# Patient Record
Sex: Male | Born: 1990 | Race: White | Hispanic: No | Marital: Single | Smoking: Former smoker
Health system: Southern US, Community
[De-identification: ages and names within clinical notes are randomized; demographics above are authoritative.]

## PROBLEM LIST (undated history)

## (undated) DIAGNOSIS — K5090 Crohn's disease, unspecified, without complications: Secondary | ICD-10-CM

## (undated) DIAGNOSIS — N200 Calculus of kidney: Secondary | ICD-10-CM

## (undated) DIAGNOSIS — I10 Essential (primary) hypertension: Secondary | ICD-10-CM

---

## 2015-07-04 DIAGNOSIS — R1031 Right lower quadrant pain: Secondary | ICD-10-CM | POA: Insufficient documentation

## 2015-07-04 DIAGNOSIS — F17200 Nicotine dependence, unspecified, uncomplicated: Secondary | ICD-10-CM | POA: Insufficient documentation

## 2015-07-04 DIAGNOSIS — R110 Nausea: Secondary | ICD-10-CM | POA: Insufficient documentation

## 2015-07-04 DIAGNOSIS — K3580 Unspecified acute appendicitis: Principal | ICD-10-CM | POA: Insufficient documentation

## 2015-07-04 DIAGNOSIS — Z87442 Personal history of urinary calculi: Secondary | ICD-10-CM | POA: Insufficient documentation

## 2015-07-05 ENCOUNTER — Observation Stay: Payer: Medicaid Other | Admitting: Anesthesiology

## 2015-07-05 ENCOUNTER — Encounter
Admission: EM | Disposition: A | Discharge status: Home / Self Care | Payer: Self-pay | Source: Home / Self Care | Attending: Emergency Medicine

## 2015-07-05 ENCOUNTER — Ambulatory Visit: Payer: Self-pay

## 2015-07-05 ENCOUNTER — Observation Stay
Admission: EM | Admit: 2015-07-05 | Discharge: 2015-07-06 | Disposition: A | Discharge status: Home / Self Care | Payer: Medicaid Other | Attending: Surgery | Admitting: Surgery

## 2015-07-05 ENCOUNTER — Observation Stay: Payer: Charity | Admitting: Surgery

## 2015-07-05 ENCOUNTER — Emergency Department: Payer: Medicaid Other

## 2015-07-05 DIAGNOSIS — K36 Other appendicitis: Secondary | ICD-10-CM

## 2015-07-05 DIAGNOSIS — K3580 Unspecified acute appendicitis: Secondary | ICD-10-CM

## 2015-07-05 DIAGNOSIS — R198 Other specified symptoms and signs involving the digestive system and abdomen: Secondary | ICD-10-CM

## 2015-07-05 DIAGNOSIS — R1031 Right lower quadrant pain: Secondary | ICD-10-CM | POA: Diagnosis present

## 2015-07-05 HISTORY — DX: Calculus of kidney: N20.0

## 2015-07-05 LAB — BASIC METABOLIC PANEL
BUN: 15 mg/dL (ref 9.0–28.0)
CO2: 24 mEq/L (ref 22–29)
Calcium: 9.7 mg/dL (ref 8.5–10.5)
Chloride: 109 mEq/L (ref 100–111)
Creatinine: 1 mg/dL (ref 0.7–1.3)
Glucose: 92 mg/dL (ref 70–100)
Potassium: 4.2 mEq/L (ref 3.5–5.1)
Sodium: 143 mEq/L (ref 136–145)

## 2015-07-05 LAB — URINALYSIS, REFLEX TO MICROSCOPIC EXAM IF INDICATED
Bilirubin, UA: NEGATIVE
Blood, UA: NEGATIVE
Glucose, UA: NEGATIVE
Leukocyte Esterase, UA: NEGATIVE
Nitrite, UA: NEGATIVE
Specific Gravity UA: 1.026 (ref 1.001–1.035)
Urine pH: 5.5 (ref 5.0–8.0)
Urobilinogen, UA: 2 mg/dL (ref 0.2–2.0)

## 2015-07-05 LAB — CBC AND DIFFERENTIAL
Basophils Absolute Automated: 0.01 10*3/uL (ref 0.00–0.20)
Basophils Automated: 0 %
Eosinophils Absolute Automated: 0.18 10*3/uL (ref 0.00–0.70)
Eosinophils Automated: 2 %
Hematocrit: 34.2 % — ABNORMAL LOW (ref 42.0–52.0)
Hgb: 11.6 g/dL — ABNORMAL LOW (ref 13.0–17.0)
Immature Granulocytes Absolute: 0.02 10*3/uL
Immature Granulocytes: 0 %
Lymphocytes Absolute Automated: 2.9 10*3/uL (ref 0.50–4.40)
Lymphocytes Automated: 30 %
MCH: 30.9 pg (ref 28.0–32.0)
MCHC: 33.9 g/dL (ref 32.0–36.0)
MCV: 91 fL (ref 80.0–100.0)
MPV: 8.7 fL — ABNORMAL LOW (ref 9.4–12.3)
Monocytes Absolute Automated: 0.63 10*3/uL (ref 0.00–1.20)
Monocytes: 6 %
Neutrophils Absolute: 5.89 10*3/uL (ref 1.80–8.10)
Neutrophils: 61 %
Nucleated RBC: 0 /100 WBC (ref 0–1)
Platelets: 322 10*3/uL (ref 140–400)
RBC: 3.76 10*6/uL — ABNORMAL LOW (ref 4.70–6.00)
RDW: 13 % (ref 12–15)
WBC: 9.63 10*3/uL (ref 3.50–10.80)

## 2015-07-05 LAB — HEPATIC FUNCTION PANEL
ALT: 13 U/L (ref 0–55)
AST (SGOT): 23 U/L (ref 5–34)
Albumin/Globulin Ratio: 1.4 (ref 0.9–2.2)
Albumin: 4.2 g/dL (ref 3.5–5.0)
Alkaline Phosphatase: 68 U/L (ref 38–106)
Bilirubin Direct: 0.3 mg/dL (ref 0.0–0.5)
Bilirubin Indirect: 0.5 mg/dL (ref 0.0–1.1)
Bilirubin, Total: 0.8 mg/dL (ref 0.2–1.2)
Globulin: 3 g/dL (ref 2.0–3.6)
Protein, Total: 7.2 g/dL (ref 6.0–8.3)

## 2015-07-05 LAB — GFR: EGFR: >60

## 2015-07-05 LAB — LIPASE: Lipase: 24 U/L (ref 8–78)

## 2015-07-05 SURGERY — LAPAROSCOPIC, APPENDECTOMY
Anesthesia: Anesthesia General | Site: Abdomen | Wound class: Clean Contaminated

## 2015-07-05 MED ORDER — DIPHENHYDRAMINE HCL 50 MG/ML IJ SOLN
25.0000 mg | Freq: Once | INTRAMUSCULAR | Status: AC
Start: 2015-07-05 — End: 2015-07-05
  Administered 2015-07-05: 25 mg via INTRAVENOUS
  Filled 2015-07-05: qty 1

## 2015-07-05 MED ORDER — DIPHENHYDRAMINE HCL 50 MG/ML IJ SOLN
12.5000 mg | Freq: Once | INTRAMUSCULAR | Status: AC
Start: 2015-07-05 — End: 2015-07-05
  Administered 2015-07-05: 12.5 mg via INTRAVENOUS
  Filled 2015-07-05: qty 1

## 2015-07-05 MED ORDER — BUPIVACAINE HCL 0.5 % IJ SOLN
INTRAMUSCULAR | Status: DC | PRN
Start: 2015-07-05 — End: 2015-07-05
  Administered 2015-07-05: 3 mL
  Administered 2015-07-05: 7 mL

## 2015-07-05 MED ORDER — NALOXONE HCL 0.4 MG/ML IJ SOLN
0.2000 mg | INTRAMUSCULAR | Status: DC | PRN
Start: 2015-07-05 — End: 2015-07-06

## 2015-07-05 MED ORDER — ONDANSETRON HCL 4 MG/2ML IJ SOLN
4.0000 mg | Freq: Once | INTRAMUSCULAR | Status: AC | PRN
Start: 2015-07-05 — End: 2015-07-05

## 2015-07-05 MED ORDER — ONDANSETRON HCL 4 MG/2ML IJ SOLN
4.0000 mg | Freq: Once | INTRAMUSCULAR | Status: AC
Start: 2015-07-05 — End: 2015-07-05
  Administered 2015-07-05: 4 mg via INTRAVENOUS
  Filled 2015-07-05: qty 2

## 2015-07-05 MED ORDER — ONDANSETRON HCL 4 MG/2ML IJ SOLN
INTRAMUSCULAR | Status: AC
Start: 2015-07-05 — End: ?
  Filled 2015-07-05: qty 2

## 2015-07-05 MED ORDER — MORPHINE SULFATE 4 MG/ML IJ/IV SOLN (WRAP)
4.0000 mg | Freq: Once | Status: AC
Start: 2015-07-05 — End: 2015-07-05
  Administered 2015-07-05: 4 mg via INTRAVENOUS
  Filled 2015-07-05: qty 1

## 2015-07-05 MED ORDER — ENOXAPARIN SODIUM 40 MG/0.4ML SC SOLN
40.0000 mg | Freq: Every day | SUBCUTANEOUS | Status: DC
Start: 2015-07-05 — End: 2015-07-06
  Administered 2015-07-05: 40 mg via SUBCUTANEOUS
  Filled 2015-07-05: qty 0.4

## 2015-07-05 MED ORDER — METOCLOPRAMIDE HCL 5 MG/ML IJ SOLN
INTRAMUSCULAR | Status: AC
Start: 2015-07-05 — End: ?
  Filled 2015-07-05: qty 2

## 2015-07-05 MED ORDER — FENTANYL CITRATE (PF) 50 MCG/ML IJ SOLN (WRAP)
INTRAMUSCULAR | Status: AC
Start: 2015-07-05 — End: ?
  Filled 2015-07-05: qty 4

## 2015-07-05 MED ORDER — ONDANSETRON HCL 4 MG/2ML IJ SOLN
INTRAMUSCULAR | Status: DC | PRN
Start: 2015-07-05 — End: 2015-07-05
  Administered 2015-07-05: 4 mg via INTRAVENOUS

## 2015-07-05 MED ORDER — SUCCINYLCHOLINE CHLORIDE 20 MG/ML IJ SOLN
INTRAMUSCULAR | Status: AC
Start: 2015-07-05 — End: ?
  Filled 2015-07-05: qty 10

## 2015-07-05 MED ORDER — HYDROMORPHONE HCL 1 MG/ML IJ SOLN
0.5000 mg | INTRAMUSCULAR | Status: DC | PRN
Start: 2015-07-05 — End: 2015-07-05
  Administered 2015-07-05 (×2): 0.5 mg via INTRAVENOUS

## 2015-07-05 MED ORDER — OXYCODONE HCL 5 MG PO TABS
5.0000 mg | ORAL_TABLET | ORAL | Status: DC | PRN
Start: 2015-07-05 — End: 2015-07-06
  Administered 2015-07-05 (×2): 5 mg via ORAL
  Filled 2015-07-05 (×2): qty 1

## 2015-07-05 MED ORDER — PROPOFOL 10 MG/ML IV EMUL (WRAP)
INTRAVENOUS | Status: AC
Start: 2015-07-05 — End: ?
  Filled 2015-07-05: qty 20

## 2015-07-05 MED ORDER — OXYCODONE HCL 5 MG PO TABS
5.0000 mg | ORAL_TABLET | ORAL | Status: DC | PRN
Start: 2015-07-05 — End: 2015-07-06
  Administered 2015-07-05 – 2015-07-06 (×4): 5 mg via ORAL
  Filled 2015-07-05 (×4): qty 1

## 2015-07-05 MED ORDER — METOCLOPRAMIDE HCL 10 MG PO TABS
10.0000 mg | ORAL_TABLET | Freq: Four times a day (QID) | ORAL | Status: DC
Start: 2015-07-05 — End: 2015-07-06

## 2015-07-05 MED ORDER — ACETAMINOPHEN 325 MG PO TABS
650.0000 mg | ORAL_TABLET | ORAL | Status: DC | PRN
Start: 2015-07-05 — End: 2015-07-06
  Administered 2015-07-06: 650 mg via ORAL
  Filled 2015-07-05: qty 2

## 2015-07-05 MED ORDER — OXYCODONE HCL 5 MG PO TABS
5.0000 mg | ORAL_TABLET | Freq: Once | ORAL | Status: AC | PRN
Start: 2015-07-05 — End: 2015-07-05

## 2015-07-05 MED ORDER — ONDANSETRON 4 MG PO TBDP
4.0000 mg | ORAL_TABLET | Freq: Once | ORAL | Status: AC
Start: 2015-07-05 — End: 2015-07-05
  Administered 2015-07-05: 4 mg via ORAL
  Filled 2015-07-05: qty 1

## 2015-07-05 MED ORDER — OXYCODONE HCL 5 MG PO TABS
5.0000 mg | ORAL_TABLET | ORAL | Status: DC | PRN
Start: 2015-07-05 — End: 2017-02-12

## 2015-07-05 MED ORDER — SODIUM CHLORIDE 0.9 % IV BOLUS
500.0000 mL | Freq: Once | INTRAVENOUS | Status: DC
Start: 2015-07-05 — End: 2015-07-05

## 2015-07-05 MED ORDER — GLYCOPYRROLATE 0.2 MG/ML IJ SOLN
INTRAMUSCULAR | Status: AC
Start: 2015-07-05 — End: ?
  Filled 2015-07-05: qty 2

## 2015-07-05 MED ORDER — HYDROMORPHONE HCL 1 MG/ML IJ SOLN
INTRAMUSCULAR | Status: AC
Start: 2015-07-05 — End: 2015-07-05
  Administered 2015-07-05: 0.5 mg via INTRAVENOUS
  Filled 2015-07-05: qty 1

## 2015-07-05 MED ORDER — HYDRALAZINE HCL 20 MG/ML IJ SOLN
10.0000 mg | Freq: Once | INTRAMUSCULAR | Status: DC
Start: 2015-07-05 — End: 2015-07-05

## 2015-07-05 MED ORDER — METOCLOPRAMIDE HCL 5 MG/ML IJ SOLN
10.0000 mg | Freq: Once | INTRAMUSCULAR | Status: AC
Start: 2015-07-05 — End: 2015-07-05
  Administered 2015-07-05: 10 mg via INTRAVENOUS
  Filled 2015-07-05: qty 2

## 2015-07-05 MED ORDER — DIPHENHYDRAMINE HCL 25 MG PO TABS
25.0000 mg | ORAL_TABLET | Freq: Four times a day (QID) | ORAL | Status: DC | PRN
Start: 2015-07-05 — End: 2015-07-06

## 2015-07-05 MED ORDER — FAMOTIDINE 20 MG/2ML IV SOLN
INTRAVENOUS | Status: AC
Start: 2015-07-05 — End: ?
  Filled 2015-07-05: qty 2

## 2015-07-05 MED ORDER — LEVOFLOXACIN IN D5W 500 MG/100ML IV SOLN
500.0000 mg | Freq: Once | INTRAVENOUS | Status: AC
Start: 2015-07-05 — End: 2015-07-05
  Administered 2015-07-05: 500 mg via INTRAVENOUS
  Filled 2015-07-05: qty 100

## 2015-07-05 MED ORDER — OXYCODONE HCL 5 MG PO TABS
5.0000 mg | ORAL_TABLET | Freq: Once | ORAL | Status: DC
Start: 2015-07-05 — End: 2015-07-05

## 2015-07-05 MED ORDER — FENTANYL CITRATE (PF) 50 MCG/ML IJ SOLN (WRAP)
INTRAMUSCULAR | Status: DC | PRN
Start: 2015-07-05 — End: 2015-07-05
  Administered 2015-07-05: 50 ug via INTRAVENOUS

## 2015-07-05 MED ORDER — PROPOFOL 10 MG/ML IV EMUL (WRAP)
INTRAVENOUS | Status: DC | PRN
Start: 2015-07-05 — End: 2015-07-05
  Administered 2015-07-05: 150 mg via INTRAVENOUS

## 2015-07-05 MED ORDER — OXYCODONE HCL 5 MG PO TABS
5.0000 mg | ORAL_TABLET | Freq: Once | ORAL | Status: AC
Start: 2015-07-05 — End: 2015-07-05

## 2015-07-05 MED ORDER — LACTATED RINGERS IV SOLN
75.0000 mL/h | INTRAVENOUS | Status: DC
Start: 2015-07-05 — End: 2015-07-06

## 2015-07-05 MED ORDER — METRONIDAZOLE IN NACL 500 MG/100 ML IV SOLN
500.0000 mg | Freq: Once | INTRAVENOUS | Status: DC
Start: 2015-07-05 — End: 2015-07-05

## 2015-07-05 MED ORDER — GLYCOPYRROLATE 0.2 MG/ML IJ SOLN
INTRAMUSCULAR | Status: DC | PRN
Start: 2015-07-05 — End: 2015-07-05
  Administered 2015-07-05: .4 mg via INTRAVENOUS

## 2015-07-05 MED ORDER — LACTATED RINGERS IV SOLN
INTRAVENOUS | Status: DC | PRN
Start: 2015-07-05 — End: 2015-07-05

## 2015-07-05 MED ORDER — IOHEXOL 240 MG/ML IJ SOLN
50.0000 mL | Freq: Once | INTRAMUSCULAR | Status: AC | PRN
Start: 2015-07-05 — End: 2015-07-05
  Administered 2015-07-05: 50 mL via ORAL

## 2015-07-05 MED ORDER — OXYCODONE HCL 5 MG PO TABS
ORAL_TABLET | ORAL | Status: AC
Start: 2015-07-05 — End: 2015-07-05
  Administered 2015-07-05: 5 mg via ORAL
  Filled 2015-07-05: qty 1

## 2015-07-05 MED ORDER — IOHEXOL 350 MG/ML IV SOLN
100.0000 mL | Freq: Once | INTRAVENOUS | Status: AC | PRN
Start: 2015-07-05 — End: 2015-07-05
  Administered 2015-07-05: 95 mL via INTRAVENOUS

## 2015-07-05 MED ORDER — METOCLOPRAMIDE HCL 5 MG/ML IJ SOLN
INTRAMUSCULAR | Status: AC
Start: 2015-07-05 — End: 2015-07-05
  Administered 2015-07-05: 10 mg via INTRAVENOUS
  Filled 2015-07-05: qty 2

## 2015-07-05 MED ORDER — LIDOCAINE HCL 1 % IJ SOLN
INTRAMUSCULAR | Status: DC | PRN
Start: 2015-07-05 — End: 2015-07-05
  Administered 2015-07-05: 3 mL
  Administered 2015-07-05: 5 mL

## 2015-07-05 MED ORDER — MIDAZOLAM HCL 2 MG/2ML IJ SOLN
INTRAMUSCULAR | Status: DC | PRN
Start: 2015-07-05 — End: 2015-07-05
  Administered 2015-07-05: 2 mg via INTRAVENOUS

## 2015-07-05 MED ORDER — LACTATED RINGERS IV SOLN
INTRAVENOUS | Status: DC
Start: 2015-07-05 — End: 2015-07-05

## 2015-07-05 MED ORDER — GLYCOPYRROLATE 0.2 MG/ML IJ SOLN
INTRAMUSCULAR | Status: AC
Start: 2015-07-05 — End: ?
  Filled 2015-07-05: qty 1

## 2015-07-05 MED ORDER — NEOSTIGMINE METHYLSULFATE 1 MG/ML IJ SOLN
INTRAMUSCULAR | Status: DC | PRN
Start: 2015-07-05 — End: 2015-07-05
  Administered 2015-07-05: 3 mg via INTRAVENOUS

## 2015-07-05 MED ORDER — ONDANSETRON HCL 4 MG/2ML IJ SOLN
INTRAMUSCULAR | Status: AC
Start: 2015-07-05 — End: 2015-07-05
  Administered 2015-07-05: 4 mg via INTRAVENOUS
  Filled 2015-07-05: qty 2

## 2015-07-05 MED ORDER — LIDOCAINE HCL 2 % IJ SOLN
INTRAMUSCULAR | Status: DC | PRN
Start: 2015-07-05 — End: 2015-07-05
  Administered 2015-07-05: 100 mg via INTRAVENOUS

## 2015-07-05 MED ORDER — MIDAZOLAM HCL 2 MG/2ML IJ SOLN
INTRAMUSCULAR | Status: AC
Start: 2015-07-05 — End: ?
  Filled 2015-07-05: qty 2

## 2015-07-05 MED ORDER — DIPHENHYDRAMINE HCL 12.5 MG/5ML PO ELIX
12.5000 mg | ORAL_SOLUTION | Freq: Four times a day (QID) | ORAL | Status: DC | PRN
Start: 2015-07-05 — End: 2015-07-06
  Administered 2015-07-05: 12.5 mg via ORAL
  Filled 2015-07-05: qty 5

## 2015-07-05 MED ORDER — MEPERIDINE HCL 25 MG/ML IJ SOLN
25.0000 mg | INTRAMUSCULAR | Status: DC | PRN
Start: 2015-07-05 — End: 2015-07-05

## 2015-07-05 MED ORDER — SODIUM CHLORIDE 0.9 % IR SOLN
Status: DC | PRN
Start: 2015-07-05 — End: 2015-07-05
  Administered 2015-07-05: 1000 mL

## 2015-07-05 MED ORDER — LIDOCAINE HCL (PF) 2 % IJ SOLN
INTRAMUSCULAR | Status: AC
Start: 2015-07-05 — End: ?
  Filled 2015-07-05: qty 5

## 2015-07-05 MED ORDER — ROCURONIUM BROMIDE 50 MG/5ML IV SOLN
INTRAVENOUS | Status: AC
Start: 2015-07-05 — End: ?
  Filled 2015-07-05: qty 5

## 2015-07-05 MED ORDER — METOCLOPRAMIDE HCL 5 MG/ML IJ SOLN
10.0000 mg | Freq: Once | INTRAMUSCULAR | Status: AC | PRN
Start: 2015-07-05 — End: 2015-07-05

## 2015-07-05 MED ORDER — DEXAMETHASONE SODIUM PHOSPHATE 20 MG/5ML IJ SOLN
INTRAMUSCULAR | Status: AC
Start: 2015-07-05 — End: ?
  Filled 2015-07-05: qty 5

## 2015-07-05 MED ORDER — DEXAMETHASONE SODIUM PHOSPHATE 4 MG/ML IJ SOLN (WRAP)
INTRAMUSCULAR | Status: DC | PRN
Start: 2015-07-05 — End: 2015-07-05
  Administered 2015-07-05: 4 mg via INTRAVENOUS

## 2015-07-05 MED ORDER — ROCURONIUM BROMIDE 50 MG/5ML IV SOLN
INTRAVENOUS | Status: DC | PRN
Start: 2015-07-05 — End: 2015-07-05
  Administered 2015-07-05: 40 mg via INTRAVENOUS

## 2015-07-05 SURGICAL SUPPLY — 50 items
APPLCATOR CHLORAPREP 26ML (Prep) ×2 IMPLANT
BLADE SRGCLPR LF STRL PVT ADJ HD DISP (Blade)
BLADE SURGICAL CLIPPER PIVOT ADJUSTABLE (Blade)
BLADE SURGICAL CLIPPER PIVOT ADJUSTABLE HEAD 9661 PURPLE (Blade) IMPLANT
CLIP ENDO 10 MM (Laparoscopy Supplies) ×2 IMPLANT
CONTAINER PATH PP 8OZ LF NS LEK RST LID (Procedure Accessories) ×2
CONTAINER PATHOLOGY POLYPROPYLENE LK ESSTNT LID FRZBL 8OZ NONSTRL LF (Procedure Accessories) ×1 IMPLANT
GLOVE SURG BIOGEL INDIC SZ 6.5 (Glove) ×2 IMPLANT
LIGATOR ESCP PDS2 0 E-LP 18IN LF MFL TIE (Suture)
LIGATOR L18 IN ENDOLOOP MONOFILAMENT TIE (Suture)
LIGATOR L18 IN ENDOLOOP MONOFILAMENT TIE LOOP SUTURE ENDOSCOPIC PDS II (Suture) IMPLANT
LINER GLOVE 6 1/2 POWDER FREE SMOOTH (Glove) ×1
LINER GLOVE 6 1/2 POWDER FREE SMOOTH BEAD CUFF NONPYROGENIC BIOGEL (Glove) ×1 IMPLANT
LINER GLV PCP 6.5 BGL SKNSN INDCTR (Glove) ×1
NEEDLE INSUFFLATION 120MM (Needles) ×2 IMPLANT
NEEDLE REG BEVEL 19GX1.5IN (Needles) ×2 IMPLANT
PACK LAPAROSCOPY ~~LOC~~ (Pack) ×2 IMPLANT
PAD ELECTROSRG GRND REM W CRD (Procedure Accessories) ×2 IMPLANT
POUCH ENDO 10MM (Laparoscopy Supplies) ×2 IMPLANT
RELOAD STAPLER 2 MM 2.5 MM 3 MM L45 MM (Staplers) ×2
RELOAD STAPLER 2 MM 2.5 MM 3 MM L45 MM ENDO GIA TITANIUM MEDIUM (Staplers) ×2 IMPLANT
RELOAD STAPLER 3 MM 3.5 MM 4 MM L45 MM (Staplers) ×2
RELOAD STAPLER 3 MM 3.5 MM 4 MM L45 MM ENDO GIA TITANIUM MEDIUM THICK (Staplers) ×2 IMPLANT
RELOAD STPLR TI 2MM 2.5MM 3MM EGIA 45MM (Staplers) ×2
RELOAD STPLR TI 3MM 3.5MM 4MM EGIA 45MM (Staplers) ×2
SHEAR SCALP HARM ACE (Cautery) IMPLANT
SLEEVE SEQUEN COMP KNEE REG (Procedure Accessories) ×2 IMPLANT
SOLUTION IRR 0.9% NACL 1000ML LF STRL (Irrigation Solutions) ×1
SOLUTION IRRIGATION 0.9% SODIUM CHLORIDE (Irrigation Solutions) ×1
SOLUTION IRRIGATION 0.9% SODIUM CHLORIDE 1000 ML PLASTIC POUR BOTTLE (Irrigation Solutions) ×1 IMPLANT
STAPLER INTNL PVC STD UNV EGIA 12MM (Staplers) ×1
STAPLER TISSUE L16 CM X W4 MM OD12 MM (Staplers) ×1
STAPLER TISSUE L16 CM X W4 MM OD12 MM ENDOSCOPIC ENDO GIA PVC INTERNAL (Staplers) ×1 IMPLANT
SUTURE ABS 4-0 PS2 VCL MTPS 18IN BRD (Suture)
SUTURE COATED VICRYL 4-0 PS-2 L18 IN (Suture)
SUTURE POLYGLACTIN 910 VICRYL 4-0 L18 IN BRAIDED UNIDIRECTIONAL PS-2 (Suture) IMPLANT
SUTURE VICRYL 0 UR6 27IN (Suture) ×2 IMPLANT
SYRINGE 20 ML BD LUER-LOK MEDICAL (Syringes, Needles) ×1 IMPLANT
SYRINGE MED 20ML LL LF STRL (Syringes, Needles) ×2
SYSTEM IMAGING 8X6IN CLEARIFY MICROFIBER WARM HUB TRCR WIPE DSPSBL (Kits) ×1 IMPLANT
SYSTEM IMG MRFBR CLEARIFY 8X6IN WRM HUB (Kits) ×2
TRAY FOLEY W URINE METER 16FR (Tray) ×2 IMPLANT
TROCAR APM HASSON BLNT TP 12MM (Endoscopic Supplies) IMPLANT
TROCAR BLADELESS ENDO 12X10MM (Laparoscopy Supplies) IMPLANT
TROCAR BLADELESS ENDO 5X100MM (Laparoscopy Supplies) ×2 IMPLANT
TROCAR XCEL SLEEVE COMP (Laparoscopy Supplies) ×2 IMPLANT
TUBE CULTURE STURTS LIQ BBL (Procedure Accessories) IMPLANT
TUBING INSFL THRMPLST 45L PNEUMOSURE (Tubing) ×1
TUBING INSUFFLATION SET HIGH FLOW (Tubing) ×1
TUBING INSUFFLATION SET HIGH FLOW TOUCHSCREEN PNEUMOSURE THERMOPLASTIC (Tubing) ×1 IMPLANT

## 2015-07-05 NOTE — UM Notes (Signed)
To [facility] ER w/ abd pain.    PMH: calculus of kidney.    VS: 97-65-100%-16-101/60.    CT of the abdomen and pelvis shows no acute inflammatory changes within the abdomen or pelvis.     REC IN ED: Zofran IV, Morphine IV, Reglan IV.    Placed in OBSERVATION status 07/05/15.    TO OR: Operative Procedure:  Laparoscopic appendectomy    Preoperative Diagnosis:  Acute appendicitis    Postoperative Diagnosis:  Acute appendicitis    POST OP ORDERS: LR @ 75, Reglan IV PRN, Zofran IV PRN, Roxicodone PO PRN, Lovenox subcu daily, regular diet, IS, I&O, VS.    Mabeline Caras, RN, BSN, MA  Utilization Review Nurse  [phone]

## 2015-07-05 NOTE — ED Provider Notes (Signed)
[facility] EMERGENCY DEPARTMENT H&P         CLINICAL SUMMARY          Diagnosis:    .     Final diagnoses:   RLQ abdominal pain   Abdominal involuntary guarding         MDM Notes:      RLQ pain, likely appendicitis. Will check CT scan, labs and reassess.       Disposition:          ED Disposition     Observation Admitting Physician: Rosaria Ferries [[id number]]  Diagnosis: RLQ abdominal pain [251439]  Estimated Length of Stay: < 2 midnights  Tentative Discharge Plan?: Home or Self Care [1]  Patient Class: Observation [104]                      CLINICAL INFORMATION        HPI:      Chief Complaint: Abdominal Pain  .    Ricky [name] is a 24 y.o. male who presents with RLQ abd pain onset yesterday afternoon. He notes associated nausea, no vomiting. Has normal appetite. Denies any testicular pain or swelling, urinary symptoms, fever, or diarrhea.      History obtained from: Patient          ROS:      Positive and negative ROS elements as per HPI.  All other systems reviewed and negative.      Interpretations:     O2 sat-           saturation: 97 %; Oxygen use: room air; Interpretation: Normal              Physical Exam:      Pulse (!) 101  BP 116/68 mmHg  Resp 16  SpO2 97 %  Temp (!) 96.5 F (35.8 C)    Constitutional: Vital signs reviewed. Well appearing. No apparent distress.  Head: Normocephalic, atraumatic  Eyes: EOMI, PERRLA, No conjunctival injection. No discharge.  ENT: Mucous membranes moist. Normal appearing oropharynx. No swelling.  Neck: Normal range of motion. Non-tender.  Respiratory/Chest: Clear to auscultation. No respiratory distress.   Cardiovascular: Regular rate and rhythm. No murmur.   Abdomen: Soft. RLQ TTP with rebound. No guarding. Non-distended. No masses or hepatosplenomegaly.  UpperExtremity: No edema. No cyanosis.  LowerExtremity: No edema. No cyanosis.  Neurological: No focal motor deficits by observation. Speech normal. Memory normal.  Skin: Warm and dry.  No rash.  Lymphatic: No cervical lymphadenopathy.  Psychiatric: Normal affect. Normal concentration.              PAST HISTORY        Primary Care Provider: Christa See, MD        PMH/PSH:    .     Past Medical History   Diagnosis Date   . Calculus of kidney        He has no past surgical history on file.      Social/Family History:      He reports that he has been smoking.  He does not have any smokeless tobacco history on file. He reports that he does not drink alcohol or use illicit drugs.    No family history on file.      Listed Medications on Arrival:    .     Previous Medications    No medications on file      Allergies: He has No Known Allergies.  VISIT INFORMATION        Clinical Course in the ED:      Throughout the stay in the Emergency Department, questions and concerns surrounding pain management, care plan, diagnostic studies, effects of medications administered or prescribed, and future care plan were assessed and addressed.    3:37 AM   Surgery aware.       Medications Given in the ED:    .     ED Medication Orders     Start Ordered     Status Ordering Provider    07/05/15 [phone] 07/05/15 0415  morphine injection 4 mg   Once     Route: Intravenous  Ordered Dose: 4 mg     Last MAR action:  Given Liddy Deam Q    07/05/15 0416 07/05/15 0415  metoclopramide (REGLAN) injection 10 mg   Once     Route: Intravenous  Ordered Dose: 10 mg     Last MAR action:  Given Lyrik Buresh Q    07/05/15 0309 07/05/15 0309  diphenhydrAMINE (BENADRYL) injection 25 mg   Once     Route: Intravenous  Ordered Dose: 25 mg     Last MAR action:  Given Yasmin Bronaugh Q    07/05/15 0208 07/05/15 0207  morphine injection 4 mg   Once     Route: Intravenous  Ordered Dose: 4 mg     Last MAR action:  Given Carrina Schoenberger Q    07/05/15 0155 07/05/15 0154  ondansetron (ZOFRAN) injection 4 mg   Once     Route: Intravenous  Ordered Dose: 4 mg     Last MAR action:  Given Knowledge Escandon Q            Procedures:       Procedures            RESULTS        Lab Results:      Results     Procedure Component Value Units Date/Time    CBC with Differential [[id number]]  (Abnormal) Collected:  07/05/15 0056    Specimen Information:  Blood from Blood Updated:  07/05/15 0111     WBC 9.63 x10 3/uL      Hgb 11.6 (L) g/dL      Hematocrit 11.9 (L) %      Platelets 322 x10 3/uL      RBC 3.76 (L) x10 6/uL      MCV 91.0 fL      MCH 30.9 pg      MCHC 33.9 g/dL      RDW 13 %      MPV 8.7 (L) fL      Neutrophils 61 %      Lymphocytes Automated 30 %      Monocytes 6 %      Eosinophils Automated 2 %      Basophils Automated 0 %      Immature Granulocyte 0 %      Nucleated RBC 0 /100 WBC      Neutrophils Absolute 5.89 x10 3/uL      Abs Lymph Automated 2.90 x10 3/uL      Abs Mono Automated 0.63 x10 3/uL      Abs Eos Automated 0.18 x10 3/uL      Absolute Baso Automated 0.01 x10 3/uL      Absolute Immature Granulocyte 0.02 x10 3/uL     UA with reflex to micro (all hospital EDs and [facility], pts 3+ yrs) [[id number]]  (  Abnormal) Collected:  07/05/15 0019    Specimen Information:  Urine Updated:  07/05/15 0041     Urine Type Clean Catch      Color, UA Yellow      Clarity, UA Clear      Specific Gravity UA 1.026      Urine pH 5.5      Leukocyte Esterase, UA Negative      Nitrite, UA Negative      Protein, UR Trace (A)      Glucose, UA Negative      Ketones UA Trace (A)      Urobilinogen, UA 2.0 mg/dL      Bilirubin, UA Negative      Blood, UA Negative      RBC, UA 0 - 5 /hpf      WBC, UA 0 - 5 /hpf      Squamous Epithelial Cells, Urine 0 - 5 /hpf     Hepatic function panel (LFT) [[id number]] Collected:  07/05/15 0001    Specimen Information:  Blood Updated:  07/05/15 0034     Bilirubin, Total 0.8 mg/dL      Bilirubin, Direct 0.3 mg/dL      Bilirubin, Indirect 0.5 mg/dL      AST (SGOT) 23 U/L      ALT 13 U/L      Alkaline Phosphatase 68 U/L      Protein, Total 7.2 g/dL      Albumin 4.2 g/dL      Globulin 3.0 g/dL      Albumin/Globulin Ratio 1.4      Basic Metabolic Panel [[id number]] Collected:  07/05/15 0001    Specimen Information:  Blood Updated:  07/05/15 0034     Glucose 92 mg/dL      BUN 30.8 mg/dL      Creatinine 1.0 mg/dL      Calcium 9.7 mg/dL      Sodium 657 mEq/L      Potassium 4.2 mEq/L      Chloride 109 mEq/L      CO2 24 mEq/L     Lipase [[id number]] Collected:  07/05/15 0001    Specimen Information:  Blood Updated:  07/05/15 0034     Lipase 24 U/L     GFR [[id number]] Collected:  07/05/15 0001     EGFR >60.0 Updated:  07/05/15 0034              Radiology Results:      CT Abd/Pelvis with IV and PO Contrast   Final Result     CT of the abdomen and pelvis shows no acute inflammatory   changes within the abdomen or pelvis.       Neldon Mc, MD    07/05/2015 3:19 AM                     Scribe Attestation:      I was acting as a Neurosurgeon for Aggie Moats, MD on Bambrick,Kyren F  Treatment Team: Scribe: Alleen Borne   I am the first provider for this patient and I personally performed the services documented. Treatment Team: Scribe: Alleen Borne is scribing for me on Brinkley,Clearnce F. This note accurately reflects work and decisions made by me.  Aggie Moats, MD            Maceo Pro, MD  07/06/15 [phone]

## 2015-07-05 NOTE — Discharge Instructions (Signed)
Laparoscopic Appendectomy Discharge Instructions    ABOUT YOUR PROCEDURE (WHAT YOU SHOULD KNOW):  A laparoscopic appendectomy is surgery to remove your appendix (a branch off of your large intestines). It is generally performed as a treatment for appendicitis. During this surgery, 3 small incisions are made in your abdomen, the largest incision is at your left side. A small scope (camera) and special tools are inserted through these incisions in order to remove the appendix.     DISCHARGE INSTRUCTIONS:    Discharge Medication:    Pain medicine:  You may be prescribed pain medicine to relieve your pain.    Follow the instructions on your prescription on how to take your pain medication.   Do not wait until the pain is severe before you take your medicine. Tell caregivers if your pain does not decrease.   Pain medicine can make you dizzy or sleepy. Prevent falls by calling someone when you get out of bed or if you need help.   Pain medication can be constipating. Be sure to take a laxative or stool softener while taking pain medication   You may also use ice packs frequently to reduce swelling and minimize pain.     Antibiotics: This medicine is given to fight or prevent an infection caused by bacteria   Always take your antibiotics exactly as ordered by your primary healthcare provider.    Do not stop taking your medicine unless directed by your primary healthcare provider.    Never save antibiotics or take leftover antibiotics that were given to you for another illness.     Take your medicine as directed: Call your primary healthcare provider if you think your medicine is not helping or if you have side effects. Tell him if you are allergic to any medicine. Keep a list of the medicines, vitamins, and herbs you take. Include the amounts, and when and why you take them. Bring the list or the pill bottles to follow-up visits. Carry your medicine list with you in case of an emergency.    Discharge  Diet:   Diet as tolerated.    You may not have much of an appetite for the first few days and this is normal. Try eating smaller, more frequent meals until your appetite improves.   Stay well hydrated by drinking plenty of fluids    Discharge Activity   Activity as tolerated, avoid which cause pain.   Walking and climbing up stairs is ok.   No lifting objects over 10lbs for risk of hernia formation.   No driving if taking pain medication    Wound Care:   You may remove any outer dressings 48 hours after surgery. The wound may then be left open to air or covered with a light gauze if desired.   There may be some light drainage from your wound. This is normal, just cover the wound with gauze to prevent staining your clothes.   If you have steri-strips (thin white bandages) under your dressing, leave them on. They will fall off on their own.   You may shower 48 hours after your surgery. Just let the soapy water run over the wounds. Do not scrub the wounds.   No baths or submerging the incisions under standing water for 2-3 weeks    Follow Up:   Follow up with your surgeon at the scheduled time. You may have to call for an appointment    Contact your primary healthcare provider or seek immediate care if:  shortness of breath  You have uncontrolled, worsening pain.  You have uncontrolled nausea or vomiting.  You are not able to have a bowel movement.  You have pus or a foul-smelling odor coming from your wound.  Blood soaks through your bandage.  There is redness around your wound that is spreading.  You have questions or concerns about your surgery, condition, or care.

## 2015-07-05 NOTE — PACU (Signed)
Pt is in phase2, will be ready to go home soon but there is no one to pick pt up from the hospital.

## 2015-07-05 NOTE — Progress Notes (Signed)
07/05/15 1342   CM Review   Acknowledgment of Outpatient/Observation Observation letter given       Yehuda Budd, LCSW  Case Management Department  [facility]  [phone]

## 2015-07-05 NOTE — H&P (Signed)
HISTORY AND PHYSICAL EXAM  Surgery  Team 1: Acute Care Surgery, [facility] [phone]    Date Time: 07/05/2015 3:50 AM  Patient Name: Ricky [name],Ricky [name]  Attending Physician: Dorathy Daft, MD  Chief Complaint: RLQ abdominal pain    History of Present Illness:   Ricky [name] is a 24 y.o. male who presents to the hospital with a one-day history of abdominal pain in his RLQ.  He endorses associated nausea.  He denies any vomiting, and has no changes to his bowel habits. He denies fevers and chills.  He has no bulges to the groin or abdominal wall.  He does not have any sign of GI bleeding.  He denies dysuria or hematuria.    Past Medical History:     Past Medical History   Diagnosis Date   . Calculus of kidney        Past Surgical History:   History reviewed. No pertinent past surgical history.    Family History:   No family history on file.    Social History:     Social History     Social History   . Marital Status: Single     Spouse Name: N/A   . Number of Children: N/A   . Years of Education: N/A     Social History Main Topics   . Smoking status: Current Every Day Smoker   . Smokeless tobacco: Not on file   . Alcohol Use: No   . Drug Use: No   . Sexual Activity: Not on file     Other Topics Concern   . Not on file     Social History Narrative   . No narrative on file       Allergies:   No Known Allergies    Medications:     Prior to Admission medications    Medication Sig Start Date End Date Taking? Authorizing Provider   diphenhydrAMINE (BENADRYL) 25 MG tablet Take 1 tablet (25 mg total) by mouth every 6 (six) hours as needed for Itching. 07/05/15 07/17/15  Aminzay, Kathrynn Speed, MD   metoclopramide (REGLAN) 10 MG tablet Take 1 tablet (10 mg total) by mouth 4 (four) times daily. 07/05/15 07/17/15  Maceo Pro, MD       Review of Systems:   General ROS: negative  ENT ROS: negative  Cardiovascular ROS: no chest pain or dyspnea on exertion  Respiratory ROS: no cough, shortness of breath, or wheezing  Gastrointestinal ROS:  positive for abdominal pain, nausea; no vomiting, appetite is normal  Genito-Urinary ROS: no dysuria, trouble voiding, or hematuria  Hematological and Lymphatic ROS: negative  Endocrine ROS: negative  Musculoskeletal ROS: negative  Neurological ROS: negative     Physical Exam:     Filed Vitals:    07/05/15 0201   BP: 141/118   Pulse: 69   Temp:    Resp: 12   SpO2: 100%       Body mass index is 24.81 kg/(m^2).    Intake and Output Summary (Last 24 hours) at Date Time  No intake or output data in the 24 hours ending 07/05/15 0350    General appearance - alert, well appearing, and in no distress, oriented to person, place, and time and normal appearing weight  Mental status - alert, oriented to person, place, and time  Lymphatics - no palpable lymphadenopathy  Chest - clear to auscultation, no wheezes, rales or rhonchi, symmetric air entry  Heart - normal rate and regular rhythm, S1  and S2 normal  Abdomen - ttp in RLQ, nondistended, soft, some voluntary guarding  Neurological - alert, oriented, normal speech, no focal findings or movement disorder noted  Musculoskeletal - no joint tenderness, deformity or swelling  Extremities - no pedal edema noted  Skin - normal coloration and turgor, no rashes, no suspicious skin lesions noted    Labs:     Results     Procedure Component Value Units Date/Time    CBC with Differential [[id number]]  (Abnormal) Collected:  07/05/15 0056    Specimen Information:  Blood from Blood Updated:  07/05/15 0111     WBC 9.63 x10 3/uL      Hgb 11.6 (L) g/dL      Hematocrit 40.9 (L) %      Platelets 322 x10 3/uL      RBC 3.76 (L) x10 6/uL      MCV 91.0 fL      MCH 30.9 pg      MCHC 33.9 g/dL      RDW 13 %      MPV 8.7 (L) fL      Neutrophils 61 %      Lymphocytes Automated 30 %      Monocytes 6 %      Eosinophils Automated 2 %      Basophils Automated 0 %      Immature Granulocyte 0 %      Nucleated RBC 0 /100 WBC      Neutrophils Absolute 5.89 x10 3/uL      Abs Lymph Automated 2.90 x10 3/uL       Abs Mono Automated 0.63 x10 3/uL      Abs Eos Automated 0.18 x10 3/uL      Absolute Baso Automated 0.01 x10 3/uL      Absolute Immature Granulocyte 0.02 x10 3/uL     UA with reflex to micro (all hospital EDs and [facility], pts 3+ yrs) [[id number]]  (Abnormal) Collected:  07/05/15 0019    Specimen Information:  Urine Updated:  07/05/15 0041     Urine Type Clean Catch      Color, UA Yellow      Clarity, UA Clear      Specific Gravity UA 1.026      Urine pH 5.5      Leukocyte Esterase, UA Negative      Nitrite, UA Negative      Protein, UR Trace (A)      Glucose, UA Negative      Ketones UA Trace (A)      Urobilinogen, UA 2.0 mg/dL      Bilirubin, UA Negative      Blood, UA Negative      RBC, UA 0 - 5 /hpf      WBC, UA 0 - 5 /hpf      Squamous Epithelial Cells, Urine 0 - 5 /hpf     Hepatic function panel (LFT) [[id number]] Collected:  07/05/15 0001    Specimen Information:  Blood Updated:  07/05/15 0034     Bilirubin, Total 0.8 mg/dL      Bilirubin, Direct 0.3 mg/dL      Bilirubin, Indirect 0.5 mg/dL      AST (SGOT) 23 U/L      ALT 13 U/L      Alkaline Phosphatase 68 U/L      Protein, Total 7.2 g/dL      Albumin 4.2 g/dL      Globulin 3.0 g/dL  Albumin/Globulin Ratio 1.4     Basic Metabolic Panel [[id number]] Collected:  07/05/15 0001    Specimen Information:  Blood Updated:  07/05/15 0034     Glucose 92 mg/dL      BUN 24.4 mg/dL      Creatinine 1.0 mg/dL      Calcium 9.7 mg/dL      Sodium 010 mEq/L      Potassium 4.2 mEq/L      Chloride 109 mEq/L      CO2 24 mEq/L     Lipase [[id number]] Collected:  07/05/15 0001    Specimen Information:  Blood Updated:  07/05/15 0034     Lipase 24 U/L     GFR [[id number]] Collected:  07/05/15 0001     EGFR >60.0 Updated:  07/05/15 0034            Rads:   Ct Abd/pelvis With Iv And Po Contrast    07/05/2015     CT of the abdomen and pelvis shows no acute inflammatory changes within the abdomen or pelvis.   Neldon Mc, MD  07/05/2015 3:19 AM           Assessment:   24 y.o.  male with history and physical exam consistent with acute appendicitis, though CT scan without any acute inflammatory changes.  Possibly this is very early acute appendicitis.    Plan:   -- To OR with Dr. Benson Setting this morning for laparoscopic appendectomy  -- Informed consent obtained.  Specifically discussed risks of bleeding, infection, damage to intestine and other intraabdominal structures, conversion to open, non-resolution of symptoms, and abscess formation.  Patient expressed understanding and agreed to proceed.  -- NPO  -- IV fluids: LR  -- Antibiotics: Levaquin/Flagyl    Discussed with Dr. Benson Setting.    Unknown Jim, MD  General Surgery Resident, PGY-3  Team [facility]: [phone]  Pager#: [phone]    I have seen and examined this patient on the date of my electronic signature with the resident and agree with the assessment and plan as documented in the note.
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
## 2015-07-05 NOTE — Plan of Care (Signed)
Problem: Pain  Goal: Patient's pain/discomfort is manageable  Outcome: Progressing  Pain controlled with po pain med per pt.Instructed pt to call nurse if prn pain med is needed.White board updated with the next prn pain med availability.    Comments;  Pt alert and oriented x 4.  Denies sob or chest pain.  Abdominal lap sites ,dermabond incision ota,cdi.  Regular diet tolerated.No report of nausea or vomiting.  Pt demonstrated proper use of incentive spirometer.  Refused SCD'S.Pt ambulating to bathroom.  Voiding without difficulty.  Benadryl given for itchiness all over.

## 2015-07-05 NOTE — Transfer of Care (Signed)
Anesthesia Transfer of Care Note    Patient: Ricky [name]    Procedures performed: Procedure(s):  LAPAROSCOPIC, APPENDECTOMY    Anesthesia type: General ETT    Patient location:Phase I PACU    Last vitals:   Filed Vitals:    07/05/15 0816   BP:    Pulse:    Temp: 36.1 C (97 F)   Resp:    SpO2:        Post pain: Continue adjustment of pain medication; 50 mcg of fentanyl given IV, continue to monitor     Mental Status:awake and alert     Respiratory Function: tolerating face mask    Cardiovascular: stable    Nausea/Vomiting: patient not complaining of nausea or vomiting    Hydration Status: adequate    Post assessment: no apparent anesthetic complications, no reportable events and no evidence of recall

## 2015-07-05 NOTE — Plan of Care (Signed)
Problem: Health Promotion  Goal: Knowledge - disease process  Extent of understanding conveyed about a specific disease process.   Outcome: Progressing  Patient A & O x 4, arrived via hospital bed, IV infusing without difficulty, site clear, Regular diet ordered at this time.  Patient complains of 6/10 pain, medicated with 1 Oxycocone 5 mg PO, will reassess in 1 hour.  Pt advised to monitor own pain, ask for prn pain meds before pain becomes intolerable.  Pt instructed on benefits and side effects of all medications, resting in bed at this time.  Abdomen Lap sites x 3 open to air, incisions approximated with Dermabond clean, dry, intact, no drainage.

## 2015-07-05 NOTE — Op Note (Signed)
OPERATIVE NOTE    Date Time: 07/05/2015 8:19 AM  Patient Name: [name],[name]  Attending Physician: No att. providers found    Date of Operation:   07/05/2015    Providers Performing:   Surgeon(s):  Rosaria Ferries, Ricky [name]  Jenelle Mages, Ricky [name]    Operative Procedure:   Procedure(s):  LAPAROSCOPIC, APPENDECTOMY    Preoperative Diagnosis:   Acute appendicitis    Postoperative Diagnosis:   Acute appendicitis     Indications:   The patient is a 24 y.o. male who presented to [facility] on 07/05/2015 with a chief complaint of severe RLQ abdominal pain, as well as nausea, of <24 hours' duration.  While his CT scan did not show any acute inflammatory process, his abdominal exam was quite impressive for severe RLQ tenderness and guarding.  His history of discomfort with movement and with shaking of the bed was also consistent with peritonitis.  Given his impressive physical exam and history, it was presumed that this could likely be an early acute appendicitis, and the decision was made to take him to the operating room.  Informed consent was obtained, specifically addressing risks of bleeding, infection, damage to intraabdominal structures such as bowel, abscess formation, and need for further procedures.  He expressed understanding and agreed to proceed.  All questions were answered to his satisfaction.    Details of Procedure:   On 07/05/2015, the patient was identified in the preoperative holding area and informed consent was obtained as above.  The patient was then brought to the operating room and placed in the supine position with left arm tucked.  SCDs were placed he had appropriately received therapeutic antibiotics for his appendicitis.  General endotracheal anesthesia was induced without difficulties.  Following a surgical pause, the abdomen was prepped and draped in the usual sterile fashion.    We began our procedure by lifting the skin at the superior aspect of the umbilicus with towel clamps and creating a  5-mm skin incision.  Blunt dissection with a hemostat was used to carry this to the level of the fascia, and then the peritoneal cavity was accessed with standard Veress needle technique.  Water drop test was conducted and found to be satisfactory.  Pneumoperitoneum was then established, insufflating the abdomen to a pressure of 15 mmHg.  A 5-mm port was then carefully placed into this supraumbilical incision.  A 5-mm 0-degree scope was inserted and used to inspect the abdomen; no injury was noted.  After instillation with local anesthetic, the suprapubic 5-mm port and left lower quadrant 12-mm ports were placed under direct visualization.  The patient was turned into the left lateral decubitus position and in slight Trendelenberg.  Using two blunt graspers, the small bowel was carefully swept away from the cecum.  The appendix was seen at the anterior aspect of the cecum, and appeared grossly normal.  There was a minimal amount of turbid fluid in the area immediately adjacent to it.  There was no evidence of perforation.  Using the [facility], a window was created in the mesoappendix at the appendiceal base.  The appendix was divided with a purple load endo-GIA stapler.  Excess staples were removed from the abdomen using the [employer].  The Harmonic was used to divide the mesoappendix.  Good hemostasis was noted, and no leakage was seen.  The specimen was placed in an Endo-catch bag and removed through the 12-mm port.    Given the grossly normal appearance of the appendix and  the patient's physical exam, we felt it would be prudent to evaluate for the presence of a Meckel's diverticulum.  The terminal ileum was identified at its junction with the cecum, and the small bowel was carefully run for a length of about 80 cm.  No diverticulum was identified.  The abdomen was dessuflated and the ports removed.  A 0-Vicryl suture was applied in a figure-of-eight fashion to close the fascia at the 12-mm left  lower quadrant port site.  Good closure was noted.  The skin was then reapproximated with 4-0 Vicryl suture and skin glue was applied.    The patient was awakened from anesthesia and brought to the recovery room in stable condition.    At the end of the case, all sponge, sharp, and instrument counts were correct.  Dr. Benson Setting  was present and scrubbed throughout the entire case.    Estimated Blood Loss:   15 mL    Replaced fluids:   1400 mL crystalloid    Implants:   * No implants in log *    Drains:   None    Specimens:        SPECIMENS (last 24 hours)      Pathology Specimens       07/05/15 0700             Specimen Information    Specimen Testing Required Routine Pathology       Specimen ID  A       Specimen Description Appendix           Complications:   None    Unknown Jim, Ricky [name]  General Surgery Resident, PGY-3  Pager#: [phone]    I was present for and participated in the entire procedure.

## 2015-07-05 NOTE — Anesthesia Preprocedure Evaluation (Signed)
Anesthesia Evaluation    AIRWAY    Mallampati: II    TM distance: >3 FB  Neck ROM: full  Mouth Opening:full   CARDIOVASCULAR    cardiovascular exam normal, regular and normal       DENTAL    no notable dental hx     PULMONARY    pulmonary exam normal and clear to auscultation     OTHER FINDINGS                      Anesthesia Plan    ASA 2     general                     intravenous induction   Detailed anesthesia plan: general endotracheal        Post op pain management: per surgeon    informed consent obtained    Plan discussed with CRNA.      pertinent labs reviewed

## 2015-07-05 NOTE — Anesthesia Postprocedure Evaluation (Signed)
Anesthesia Post Evaluation    Patient: Ricky [name]    Procedure(s):  LAPAROSCOPIC, APPENDECTOMY    Anesthesia type: general    Last Vitals:   Filed Vitals:    07/05/15 0830   BP: 101/64   Pulse: 64   Temp:    Resp: 18   SpO2: 100%       Patient Location: Phase I PACU      Post Pain: Patient not complaining of pain, continue current therapy    Mental Status: awake    Respiratory Function: tolerating face mask    Cardiovascular: stable    Nausea/Vomiting: patient not complaining of nausea or vomiting    Hydration Status: adequate    Post Assessment: no apparent anesthetic complications, no reportable events and no evidence of recall          Anesthesia Qualified Clinical Data Registry    Central Line      CVC insertion : NO                                               Perioperative temperature management      General/neuraxial anesthesia > or = 60 minutes (excluding CABG) : YES              > Use of intraoperative active warming : YES              > Temperature > or = 36 degrees Centigrade (96.8 degrees Farenheit) during time span from 30 minutes before up to 15 minutes after anesthesia end time : YES      Administration of antibiotic prophylaxis      Age > or = 18, with IV access, with surgical procedure for which antibiotic prophylaxis indicated, and not on chronic antibiotics : YES              > Prophylactic antibiotics within 1 hour of incision (or fluroroquinolone/vancomycin within 2 hours of incision) : YES    Medication Administration      Ordering or administration of drug inconsistent with intended drug, dose, delivery or timing : NO      Dental loss/damage      Dental injury with administration of anesthesia : NO      Difficult intubation due to unrecognized difficult airway        Elective airway procedure including but not limited to: tracheostomy, fiberoptic bronchoscopy, rigid bronchoscopy; jet ventilation; or elective use of a device to facilitate airway management such as a Glidescope : NO                 > Unanticipated difficult intubation post pre-evaluation : NO      Aspiration of gastric contents        Aspiration of gastric contents : NO                    Surgical fire        Procedure requiring electrocautery/laser : YES                > Ignition/burning in invasive procedure location : NO      Immediate perioperative cardiac arrest        Cardiac arrest in OR or PACU : NO                    Unplanned hospital  admission        Unplanned hospital admission for initially intended outpatient anesthesia service : NO      Unplanned ICU admission        Unplanned ICU admission related to anesthesia occurring within 24 hours of induction or start of MAC : NO      Surgical case cancellation        Cancellation of procedure after care already started by anesthesia care team : NO      Post-anesthesia transfer of care checklist/protocol to PACU        Transfer from OR to PACU upon case conclusion : YES              > Use of PACU transfer checklist/protocol : YES     (Includes the key elements of: patient identification, responsible practitioner identification (PACU nurse or advanced practitioner), discussion of pertinent history and procedure course, intraoperative anesthetic management, post-procedure plans, acknowledgement/questions)    Post-anesthesia transfer of care checklist/protocol to ICU        Transfer from OR to ICU upon case conclusion : NO                    Post-operative nausea/vomiting risk protocol        Post-operative nausea/vomiting risk protocol : YES  Patient > or = 18 with care initiated by anesthesia team that has a risk factor screen for post-op nausea/vomiting (Includes male, hx PONV, or motion sickness, non-smoker, intended opioid administration for post-op analgesia.)    Anaphylaxis        Anaphylaxis during anesthesia services : NO    (Inclusive of any suspected transfusion reaction in association with blood-bank confirmed blood product incompatibility)              Joselyn Arrow, 07/05/2015 8:40 AM

## 2015-07-05 NOTE — Brief Op Note (Signed)
BRIEF OP NOTE    Date Time: 07/05/2015 8:17 AM    Patient Name:   Ricky [name]    Date of Operation:   07/05/2015    Providers Performing:   Surgeon(s) and Role:     * Rosaria Ferries, MD - Primary     Jeffie Pollock, Christene Slates, MD - Resident - Assisting    Operative Procedure:   Laparoscopic appendectomy    Preoperative Diagnosis:   Acute appendicitis    Postoperative Diagnosis:   Acute appendicitis    Anesthesia:   General    Fluids (I/O):   EBL:  15 mL  UOP: None  Crystalloid: 1400 mL  Colloid: None  Blood Products: None    Implants:   * No implants in log *    Drains:   None    Specimens:        SPECIMENS (last 24 hours)      Pathology Specimens       07/05/15 0700             Specimen Information    Specimen Testing Required Routine Pathology       Specimen ID  A       Specimen Description Appendix           Findings:   Appendix, appeared normal in caliber  Small bowel run for 2.5 feet from terminal ileum, no Meckel's diverticulum noted    Wound Class:   Clean Contaminated    Complications:   None    Unknown Jim, MD  General Surgery Resident, PGY-3  Team Spectra: [phone]  Pager#: [phone]

## 2015-07-05 NOTE — Progress Notes (Signed)
Patient s/p lap appendectomy--uncomplicated.    Patient feels well, but due to Anesthesia policies, cannot d/c patient home w/in 24hrs of receiving anesthesia w/o a place to go nor a person to observe him in the meantime. Patient also has no money in order to pay for medications.    BP 94/55 mmHg  Pulse 72  Temp(Src) 97.1 F (36.2 C) (Temporal Artery)  Resp 16  Ht 1.6 m (5\' 3" )  Wt 63.504 kg (140 lb)  BMI 24.81 kg/m2  SpO2 98%    Gen: awake, NAD, but tearful  CV: RRR  Pulm: CTAB  Abd: soft, ND, ATTP; incisions covered w/ skin glue  Ext: moves all 4 extremities    A/P: [name] POD#0 s/p lap appendectomy who is doing well  Will admit for 23hr Obs  Regular diet  Pain control PRN  Encourage OOB, IS use  CM c/s to help w/ safe d/c    Hazeline Junker, MD  General Surgery Resident, PGY-5  Pager: [phone]

## 2015-07-05 NOTE — Consults (Signed)
Please see SW note. Case management will pay for pt's ride to [place], [place] on Friday. We will not pay for pt's meds.     Yehuda Budd, LCSW  Case Management Department  [facility]  [phone]

## 2015-07-06 ENCOUNTER — Encounter: Payer: Self-pay | Admitting: Surgery

## 2015-07-06 DIAGNOSIS — K353 Acute appendicitis with localized peritonitis: Secondary | ICD-10-CM

## 2015-07-06 DIAGNOSIS — K37 Unspecified appendicitis: Secondary | ICD-10-CM

## 2015-07-06 DIAGNOSIS — R1031 Right lower quadrant pain: Secondary | ICD-10-CM

## 2015-07-06 DIAGNOSIS — R198 Other specified symptoms and signs involving the digestive system and abdomen: Secondary | ICD-10-CM

## 2015-07-06 MED ORDER — ACETAMINOPHEN 325 MG PO TABS
650.0000 mg | ORAL_TABLET | ORAL | Status: DC | PRN
Start: 2015-07-06 — End: 2017-02-12

## 2015-07-06 NOTE — Progress Notes (Signed)
Discharge orders have been entered for patient.  Discharge orders will be reviewed with patient and all questions and concerns will be addressed to patient's satisfaction.  Patient is being discharged and will be going by Hospital paid for cab to [facility] where his father lives.  Patient has stated that he will depart the unit on foot.  Dallow Hanley Hays, Case Manager, will set up [employer] for 11 a.m., and will provided necessary voucher to patient.

## 2015-07-06 NOTE — Plan of Care (Signed)
Problem: Pain  Goal: Patient's pain/discomfort is manageable  Outcome: Adequate for Discharge  Patient is alert and oriented x 4.  Patient has been instructed to monitor his pain and to request pain medication when available.  Patient has verbalized understanding and agreement.

## 2015-07-06 NOTE — Final Progress Note (DC Note for stay less than 48 (Signed)
Final Progress Note:    Ricky Long. Patient doing well. Pain controlled. Tolerating diet. Ambulating. Voiding.     BP 111/63 mmHg  Pulse 58  Temp(Src) 97.4 F (36.3 C) (Oral)  Resp 16  Ht 1.6 m (5\' 3" )  Wt 63.504 kg (140 lb)  BMI 24.81 kg/m2  SpO2 97%    Gen: awake, NAD  CV: RRR  Pulm: CTAB  Abd: soft, ND, ATTP; incisions c/d/i  Ext: moves all 4 extremities    A/P: [name] POD#1 s/p lap appendectomy who is doing well  Regular diet  Pain control PRN  Encourage OOB, IS use  Plan is for CM to give patient cab fare for transport back to [facility] today       Discharge Medication List      Taking          diphenhydrAMINE 25 MG tablet   Dose:  25 mg   Commonly known as:  BENADRYL   Take 1 tablet (25 mg total) by mouth every 6 (six) hours as needed for Itching.       metoclopramide 10 MG tablet   Dose:  10 mg   Commonly known as:  REGLAN   Take 1 tablet (10 mg total) by mouth 4 (four) times daily.       oxyCODONE 5 MG immediate release tablet   Dose:  5-10 mg   Commonly known as:  ROXICODONE   Take 1-2 tablets (5-10 mg total) by mouth every 4 (four) hours as needed for Pain.           [facility]  [address]  [place] [address]  [phone]  Schedule an appointment as soon as possible for a visit on 07/20/2015  General Surgery - follow up in Acute Care Surgery (ACS) clinic on Fridays in 1 or 2 weeks.  If you are unable to go to a Friday clinic, follow up with Dr. Benson Setting.    Patient instructions  Laparoscopic Appendectomy Discharge Instructions    ABOUT YOUR PROCEDURE (WHAT YOU SHOULD KNOW):  A laparoscopic appendectomy is surgery to remove your appendix (a branch off of your large intestines). It is generally performed as a treatment for appendicitis. During this surgery, 3 small incisions are made in your abdomen, the largest incision is at your left side. A small scope (camera) and special tools are inserted through these incisions in order to remove the  appendix.     DISCHARGE INSTRUCTIONS:    Discharge Medication:    Pain medicine:  You may be prescribed pain medicine to relieve your pain.    Follow the instructions on your prescription on how to take your pain medication.   Do not wait until the pain is severe before you take your medicine. Tell caregivers if your pain does not decrease.   Pain medicine can make you dizzy or sleepy. Prevent falls by calling someone when you get out of bed or if you need help.   Pain medication can be constipating. Be sure to take a laxative or stool softener while taking pain medication   You may also use ice packs frequently to reduce swelling and minimize pain.     Antibiotics: This medicine is given to fight or prevent an infection caused by bacteria   Always take your antibiotics exactly as ordered by your primary healthcare provider.    Do not stop taking your medicine unless directed by your primary healthcare provider.    Never  save antibiotics or take leftover antibiotics that were given to you for another illness.     Take your medicine as directed: Call your primary healthcare provider if you think your medicine is not helping or if you have side effects. Tell him if you are allergic to any medicine. Keep a list of the medicines, vitamins, and herbs you take. Include the amounts, and when and why you take them. Bring the list or the pill bottles to follow-up visits. Carry your medicine list with you in case of an emergency.    Discharge Diet:   Diet as tolerated.    You may not have much of an appetite for the first few days and this is normal. Try eating smaller, more frequent meals until your appetite improves.   Stay well hydrated by drinking plenty of fluids    Discharge Activity   Activity as tolerated, avoid which cause pain.   Walking and climbing up stairs is ok.   No lifting objects over 10lbs for risk of hernia formation.   No driving if taking pain medication    Wound Care:   You may remove  any outer dressings 48 hours after surgery. The wound may then be left open to air or covered with a light gauze if desired.   There may be some light drainage from your wound. This is normal, just cover the wound with gauze to prevent staining your clothes.   If you have steri-strips (thin white bandages) under your dressing, leave them on. They will fall off on their own.   You may shower 48 hours after your surgery. Just let the soapy water run over the wounds. Do not scrub the wounds.   No baths or submerging the incisions under standing water for 2-3 weeks    Follow Up:   Follow up with your surgeon at the scheduled time. You may have to call for an appointment    Contact your primary healthcare provider or seek immediate care if:   You have a fever greater than 101 degrees.   You have chills, a cough, or feel weak and achy.   You have chest pain or shortness of breath   You have uncontrolled, worsening pain.   You have uncontrolled nausea or vomiting.   You are not able to have a bowel movement.   You have pus or a foul-smelling odor coming from your wound.   Blood soaks through your bandage.   There is redness around your wound that is spreading.   You have questions or concerns about your surgery, condition, or care.         Ricky Sorrow, MD  General Surgery, R1   [phone]

## 2015-07-06 NOTE — Progress Notes (Signed)
NAEON. Patient doing well. Pain controlled. Tolerating diet. Ambulating. Voiding.     BP 111/63 mmHg  Pulse 58  Temp(Src) 97.4 F (36.3 C) (Oral)  Resp 16  Ht 1.6 m (5\' 3" )  Wt 63.504 kg (140 lb)  BMI 24.81 kg/m2  SpO2 97%    Gen: awake, NAD  CV: RRR  Pulm: CTAB  Abd: soft, ND, ATTP; incisions c/d/i  Ext: moves all 4 extremities    A/P: [name] POD#1 s/p lap appendectomy who is doing well  Regular diet  Pain control PRN  Encourage OOB, IS use  Plan is for CM to give patient cab fare for transport back to [facility] today      Raye Sorrow, MD  General Surgery, R1   [phone]    I was present for the physical examination and have reviewed the notes, assessments, and/or procedures performed by the resident.  I have edited the note to reflect any changes I have made.  I am in agreement with their plan upon my signature as an Attending.

## 2015-07-06 NOTE — Progress Notes (Addendum)
Systems Case Management Progress Note    [employer] [phone]   Time of pick-up 11:00am   Cost tbd   Financial Assessment completed per [facility] services policy yes   Comments Reference # [phone]   SCM Approved by: [name]       Case Management has verified that transportation arrangements are not readily accessible or affordable to the patient/family.     Sindy Messing, MSW  Care Coordinator  [facility]   Neuro NT7  Spectra: [phone]

## 2015-07-06 NOTE — UM Notes (Signed)
Abdominal Pain: Observation Care    07/06/15: D/C home today  111/63, 58, 97.4, 16  SpO2 97%  POD#1 s/p lap appendectomy  Plan is for CM to give patient cab fare for transport back to [place] today

## 2015-07-10 LAB — LAB USE ONLY - HISTORICAL SURGICAL PATHOLOGY

## 2015-07-14 ENCOUNTER — Inpatient Hospital Stay
Admit: 2015-07-14 | Discharge: 2015-07-14 | Disposition: A | Discharge status: Home / Self Care | Payer: MEDICAID | Attending: Emergency Medicine

## 2015-07-14 DIAGNOSIS — R110 Nausea: Secondary | ICD-10-CM

## 2015-07-14 LAB — CBC WITH AUTOMATED DIFF
ABS. BASOPHILS: 0 10*3/uL (ref 0.0–0.2)
ABS. EOSINOPHILS: 0 10*3/uL (ref 0.0–0.7)
ABS. LYMPHOCYTES: 1.6 10*3/uL (ref 1.2–3.4)
ABS. MONOCYTES: 0.3 10*3/uL — ABNORMAL LOW (ref 1.1–3.2)
ABS. NEUTROPHILS: 1.4 10*3/uL (ref 1.4–6.5)
BASOPHILS: 0 % (ref 0–2)
EOSINOPHILS: 1 % (ref 0–5)
HCT: 38.9 % (ref 36.8–45.2)
HGB: 13 g/dL (ref 12.8–15.0)
IMMATURE GRANULOCYTES: 0 % (ref 0.0–5.0)
LYMPHOCYTES: 48 % — ABNORMAL HIGH (ref 16–40)
MCH: 31.1 PG — ABNORMAL HIGH (ref 27–31)
MCHC: 33.4 g/dL (ref 32–36)
MCV: 93.1 FL (ref 81–99)
MONOCYTES: 8 % (ref 0–12)
MPV: 8.5 FL (ref 7.4–10.4)
NEUTROPHILS: 43 % (ref 40–70)
PLATELET: 334 10*3/uL (ref 140–450)
RBC: 4.18 M/uL (ref 4.0–5.2)
RDW: 12.7 % (ref 11.5–14.5)
WBC: 3.3 10*3/uL — ABNORMAL LOW (ref 4.8–10.8)

## 2015-07-14 LAB — METABOLIC PANEL, COMPREHENSIVE
A-G Ratio: 1.3 (ref 1.0–3.1)
ALT (SGPT): 20 U/L (ref 12.0–78.0)
AST (SGOT): 19 U/L (ref 15–37)
Albumin: 4.1 g/dL (ref 3.40–5.00)
Alk. phosphatase: 67 U/L (ref 46–116)
Anion gap: 19 mmol/L — ABNORMAL HIGH (ref 10–17)
BUN/Creatinine ratio: 12 (ref 6.0–20.0)
BUN: 11 MG/DL (ref 7–18)
Bilirubin, total: 0.9 MG/DL (ref 0.20–1.00)
CO2: 23 mmol/L (ref 21–32)
Calcium: 8.8 MG/DL (ref 8.5–10.1)
Chloride: 105 mmol/L (ref 98–107)
Creatinine: 0.91 MG/DL (ref 0.6–1.3)
GFR est AA: >60 mL/min/{1.73_m2} (ref >60)
GFR est non-AA: >60 mL/min/{1.73_m2} (ref >60)
Globulin: 3.2 g/dL
Glucose: 76 mg/dL (ref 74–106)
Potassium: 4 mmol/L (ref 3.50–5.10)
Protein, total: 7.3 g/dL (ref 6.40–8.20)
Sodium: 143 mmol/L (ref 136–145)

## 2015-07-14 MED ORDER — KETOROLAC TROMETHAMINE 30 MG/ML INJECTION
30 mg/mL (1 mL) | INTRAMUSCULAR | Status: AC
Start: 2015-07-14 — End: 2015-07-14
  Administered 2015-07-14: 13:00:00 via INTRAVENOUS

## 2015-07-14 MED ORDER — ONDANSETRON (PF) 4 MG/2 ML INJECTION
4 mg/2 mL | INTRAMUSCULAR | Status: AC
Start: 2015-07-14 — End: 2015-07-14
  Administered 2015-07-14: 13:00:00 via INTRAVENOUS

## 2015-07-14 MED ORDER — SODIUM CHLORIDE 0.9% BOLUS IV
0.9 % | INTRAVENOUS | Status: AC
Start: 2015-07-14 — End: 2015-07-14
  Administered 2015-07-14: 13:00:00 via INTRAVENOUS

## 2015-07-14 MED ORDER — ONDANSETRON 8 MG TAB, RAPID DISSOLVE
8 mg | ORAL_TABLET | Freq: Three times a day (TID) | ORAL | 0 refills | Status: DC | PRN
Start: 2015-07-14 — End: 2016-10-03

## 2015-07-14 MED FILL — KETOROLAC TROMETHAMINE 30 MG/ML INJECTION: 30 mg/mL (1 mL) | INTRAMUSCULAR | Qty: 1

## 2015-07-14 MED FILL — SODIUM CHLORIDE 0.9 % IV: INTRAVENOUS | Qty: 1000

## 2015-07-14 MED FILL — ONDANSETRON (PF) 4 MG/2 ML INJECTION: 4 mg/2 mL | INTRAMUSCULAR | Qty: 4

## 2015-07-14 NOTE — ED Triage Notes (Signed)
Appendectomy Monday. Pain and nausea started yesterday.

## 2015-07-14 NOTE — ED Provider Notes (Signed)
HPI Comments: Cameron Perry is a 24 y.o. male who had an appendectomy 6 days ago  presents to the ED with complaint of nausea for the past 2 days. Associated sx include LLQ abdominal pain at the site of his laparoscopic incisions. Denies vomiting or dysuria. He has a follow up appointment with his surgeon in 2 days.           Patient is a 24 y.o. male presenting with nausea. The history is provided by the patient.   Nausea    This is a new problem. The current episode started 2 days ago. The problem has not changed since onset.There has been no fever. Associated symptoms include abdominal pain. Pertinent negatives include no chills, no fever, no diarrhea, no headaches, no cough and no headaches. The patient is not pregnant.       History reviewed. No pertinent past medical history.    Past Surgical History:   Procedure Laterality Date   ??? Hx appendectomy         History reviewed. No pertinent family history.    Social History     Social History   ??? Marital status: N/A     Spouse name: N/A   ??? Number of children: N/A   ??? Years of education: N/A     Occupational History   ??? Not on file.     Social History Main Topics   ??? Smoking status: Not on file   ??? Smokeless tobacco: Not on file   ??? Alcohol use Not on file   ??? Drug use: Not on file   ??? Sexual activity: Not on file     Other Topics Concern   ??? Not on file     Social History Narrative         ALLERGIES: Review of patient's allergies indicates no known allergies.    Review of Systems   Constitutional: Negative.  Negative for chills, diaphoresis and fever.   HENT: Negative.  Negative for sore throat.    Respiratory: Negative.  Negative for cough and shortness of breath.    Cardiovascular: Negative.  Negative for chest pain and palpitations.   Gastrointestinal: Positive for abdominal pain and nausea. Negative for diarrhea and vomiting.   Genitourinary: Negative.  Negative for dysuria, flank pain, frequency, hematuria and urgency.    Musculoskeletal: Negative.  Negative for joint swelling.   Skin: Negative.  Negative for rash.   Allergic/Immunologic: Negative.  Negative for immunocompromised state.   Neurological: Negative.  Negative for speech difficulty, weakness and headaches.   All other systems reviewed and are negative.      Vitals:    07/14/15 0824   BP: 115/78   Pulse: 82   Resp: 18   Temp: 98 ??F (36.7 ??C)   SpO2: 100%   Weight: 59 kg (130 lb)   Height:  (1.6 m)            Physical Exam   Constitutional: He is oriented to person, place, and time. He appears well-developed and well-nourished. No distress.   HENT:   Head: Normocephalic and atraumatic.   Eyes: Conjunctivae and EOM are normal. Pupils are equal, round, and reactive to light. Right eye exhibits no discharge. Left eye exhibits no discharge. No scleral icterus.   Cardiovascular: Normal rate, regular rhythm and normal heart sounds.  Exam reveals no gallop and no friction rub.    No murmur heard.  Pulmonary/Chest: Effort normal and breath sounds normal. No respiratory distress. He  has no wheezes. He has no rales.   Abdominal: Soft. Bowel sounds are normal. He exhibits no distension. There is tenderness (mild,incision site). There is guarding (voluntary). There is no rebound.   laparoscopic puncture wounds    Musculoskeletal: Normal range of motion. He exhibits no edema or tenderness.   Neurological: He is alert and oriented to person, place, and time.   Skin: Skin is warm and dry. No rash noted. He is not diaphoretic. No erythema.   Psychiatric: He has a normal mood and affect. His behavior is normal. Judgment and thought content normal.   Nursing note and vitals reviewed.       MDM  Number of Diagnoses or Management Options  Nausea:   Diagnosis management comments: 24 y.o. male with nausea after an appendectomy 6 days ago . Exteremly unlikely to be abscess as the pt is not immunocompromised and there was no surgical complications. Exam is benign.       Plan:   IVF   IV Zofran  Labs: CBC, CMP  Reassess for the need for imaging  -----  8:36 AM  Documented by Ileene Rubens, acting as a scribe for Dr. Judithann Sauger, MD      PROVIDER ATTESTATION:  10:17 AM    The entirety of this note, signed by me, accurately reflects all works, treatments, procedures, and medical decision making performed by me, Judithann Sauger, MD.            Patient Progress  Patient progress: stable    ED Course       Lab Data:  Labs Reviewed   CBC WITH AUTOMATED DIFF - Abnormal; Notable for the following:        Result Value    WBC 3.3 (*)     MCH 31.1 (*)     LYMPHOCYTES 48 (*)     ABS. MONOCYTES 0.3 (*)     All other components within normal limits   METABOLIC PANEL, COMPREHENSIVE - Abnormal; Notable for the following:     Anion gap 19 (*)     All other components within normal limits     ED Course:   10:15 AM  Pt reassessed. He states that he is feeling better and the pain is resolved. No need for imaging. Pt ready for discharge.     Procedures

## 2015-07-14 NOTE — ED Notes (Signed)
Pt discharged with discharge instructions and rx zofran odt.

## 2016-09-25 ENCOUNTER — Emergency Department
Admission: EM | Admit: 2016-09-25 | Discharge: 2016-09-25 | Disposition: A | Discharge status: Home / Self Care | Payer: Self-pay | Attending: Emergency Medicine | Admitting: Emergency Medicine

## 2016-09-25 ENCOUNTER — Encounter: Payer: Self-pay | Admitting: Emergency Medicine

## 2016-09-25 ENCOUNTER — Emergency Department: Payer: Self-pay

## 2016-09-25 DIAGNOSIS — F17200 Nicotine dependence, unspecified, uncomplicated: Secondary | ICD-10-CM | POA: Insufficient documentation

## 2016-09-25 DIAGNOSIS — N200 Calculus of kidney: Secondary | ICD-10-CM | POA: Insufficient documentation

## 2016-09-25 LAB — URINALYSIS COMPLETE WITH MICROSCOPIC (ARMC ONLY)
BACTERIA UA: NONE SEEN
BILIRUBIN URINE: NEGATIVE
GLUCOSE, UA: NEGATIVE mg/dL
HGB URINE DIPSTICK: NEGATIVE
KETONES UR: NEGATIVE mg/dL
LEUKOCYTES UA: NEGATIVE
NITRITE: NEGATIVE
PH: 6 (ref 5.0–8.0)
Protein, ur: NEGATIVE mg/dL
SPECIFIC GRAVITY, URINE: 1.009 (ref 1.005–1.030)
WBC, UA: NONE SEEN WBC/hpf (ref 0–5)

## 2016-09-25 LAB — CHLAMYDIA/NGC RT PCR (ARMC ONLY)
CHLAMYDIA TR: NOT DETECTED
N gonorrhoeae: NOT DETECTED

## 2016-09-25 MED ORDER — TRAMADOL HCL 50 MG PO TABS
50.0000 mg | ORAL_TABLET | Freq: Once | ORAL | Status: AC
  Administered 2016-09-25: 50 mg via ORAL
  Filled 2016-09-25: qty 1

## 2016-09-25 MED ORDER — TRAMADOL HCL 50 MG PO TABS: 50.0000 mg | ORAL_TABLET | Freq: Four times a day (QID) | ORAL | 0 refills | Status: AC | PRN

## 2016-09-25 NOTE — ED Notes (Signed)
See triage note  States he is having increased pain with urination over the past couple of days   Worse today and feels like voiding small amts  Denies any penile discharge

## 2016-09-25 NOTE — ED Provider Notes (Signed)
[facility] Emergency Department Provider Note   ____________________________________________   First MD Initiated Contact with Patient 09/25/16 [phone]     (approximate)  I have reviewed the triage vital signs and the nursing notes.   HISTORY  Chief Complaint Dysuria    HPI Douglas Payne is a 25 y.o. male patient states 3-4 days of urinary frequency and burning.Patient also complaining of 2 days of right flank pain.  Patient stated last sexual contact was approximate 4 months ago. Patient denies urethral discharge. Patient denies any other provocative incident for his flank pain. Patient rated the pain as 8/10. Patient describes pain as "sharp". No palliative measures for this complaint.   History reviewed. No pertinent past medical history.  There are no active problems to display for this patient.   History reviewed. No pertinent surgical history.  Prior to Admission medications   Medication Sig Start Date End Date Taking? Authorizing Provider  traMADol (ULTRAM) 50 MG tablet Take 1 tablet (50 mg total) by mouth every 6 (six) hours as needed for moderate pain. 09/25/16   Joni Reiningonald K Hayleen Clinkscales, PA-C    Allergies Nsaids  No family history on file.  Social History Social History  Substance Use Topics  . Smoking status: Current Every Day Smoker  . Smokeless tobacco: Never Used  . Alcohol use No    Review of Systems Constitutional: No fever/chills Eyes: No visual changes. ENT: No sore throat. Cardiovascular: Denies chest pain. Respiratory: Denies shortness of breath. Gastrointestinal: No abdominal pain.  No nausea, no vomiting.  No diarrhea.  No constipation. Genitourinary: Negative for dysuria. Musculoskeletal: Left flank pain Skin: Negative for rash. Neurological: Negative for headaches, focal weakness or numbness.    ____________________________________________   PHYSICAL EXAM:  VITAL SIGNS: ED Triage Vitals  Enc Vitals Group    BP 09/25/16 0821 131/76     Pulse Rate 09/25/16 0821 97     Resp 09/25/16 0821 20     Temp 09/25/16 0821 97.4 F (36.3 C)     Temp Source 09/25/16 0821 Oral     SpO2 09/25/16 0821 99 %     Weight 09/25/16 0822 145 lb (65.8 kg)     Height 09/25/16 0822 5\' 3"  (1.6 m)     Head Circumference --      Peak Flow --      Pain Score 09/25/16 0822 8     Pain Loc --      Pain Edu? --      Excl. in GC? --     Constitutional: Alert and oriented. Well appearing and in no acute distress. Eyes: Conjunctivae are normal. PERRL. EOMI. Head: Atraumatic. Nose: No congestion/rhinnorhea. Mouth/Throat: Mucous membranes are moist.  Oropharynx non-erythematous. Neck: No stridor.  No cervical spine tenderness to palpation. Hematological/Lymphatic/Immunilogical: No cervical lymphadenopathy. Cardiovascular: Normal rate, regular rhythm. Grossly normal heart sounds.  Good peripheral circulation. Respiratory: Normal respiratory effort.  No retractions. Lungs CTAB. Gastrointestinal: Soft and nontender. No distention. No abdominal bruits. Right CVA guarding. Musculoskeletal: No lower extremity tenderness nor edema.  No joint effusions. Neurologic:  Normal speech and language. No gross focal neurologic deficits are appreciated. No gait instability. Skin:  Skin is warm, dry and intact. No rash noted. Psychiatric: Mood and affect are normal. Speech and behavior are normal.  ____________________________________________   LABS (all labs ordered are listed, but only abnormal results are displayed)  Labs Reviewed  URINALYSIS COMPLETEWITH MICROSCOPIC ([facility] ONLY) - Abnormal; Notable for the following:  Result Value   Color, Urine YELLOW (*)    APPearance CLEAR (*)    Squamous Epithelial / LPF 0-5 (*)    All other components within normal limits  CHLAMYDIA/NGC RT PCR ([facility] ONLY)    ____________________________________________  EKG   ____________________________________________  RADIOLOGY  CT renal stone scan revealed possible nonobstructive stone lower pole of the right kidney. ____________________________________________   PROCEDURES  Procedure(s) performed: None  Procedures  Critical Care performed: No  ____________________________________________   INITIAL IMPRESSION / ASSESSMENT AND PLAN / ED COURSE  Pertinent labs & imaging results that were available during my care of the patient were reviewed by me and considered in my medical decision making (see chart for details).  Nonobstructive kidney stone. Patient given discharge care instructions. Patient given a prescription for naproxen.  Clinical Course    Discuss urinalysis and CT scanning findings with patient. ____________________________________________   FINAL CLINICAL IMPRESSION(S) / ED DIAGNOSES  Final diagnoses:  Kidney stone      NEW MEDICATIONS STARTED DURING THIS VISIT:  New Prescriptions   TRAMADOL (ULTRAM) 50 MG TABLET    Take 1 tablet (50 mg total) by mouth every 6 (six) hours as needed for moderate pain.     Note:  This document was prepared using Dragon voice recognition software and may include unintentional dictation errors.    Joni Reiningonald K Chelle Cayton, PA-C 09/25/16 1059    Emily FilbertJonathan E Williams, MD 09/25/16 1257

## 2016-09-25 NOTE — ED Triage Notes (Signed)
Pt with burning while urinating and frequency.

## 2016-10-03 ENCOUNTER — Inpatient Hospital Stay
Admit: 2016-10-03 | Discharge: 2016-10-03 | Disposition: A | Discharge status: Home / Self Care | Payer: PRIVATE HEALTH INSURANCE | Attending: Pediatric Emergency Medicine

## 2016-10-03 DIAGNOSIS — R109 Unspecified abdominal pain: Secondary | ICD-10-CM

## 2016-10-03 LAB — URINALYSIS W/ RFLX MICROSCOPIC
Bilirubin: NEGATIVE
Blood: NEGATIVE
Glucose: NEGATIVE mg/dL
Ketone: NEGATIVE mg/dL
Leukocyte Esterase: NEGATIVE
Nitrites: NEGATIVE
Specific gravity: 1.02 (ref 1.003–1.030)
Urobilinogen: 0.2 EU/dL (ref 0.2–1.0)
pH (UA): 7 (ref 5.0–7.0)

## 2016-10-03 MED ORDER — TRAMADOL 50 MG TAB
50 mg | ORAL | Status: AC
Start: 2016-10-03 — End: 2016-10-03
  Administered 2016-10-03: 22:00:00 via ORAL

## 2016-10-03 MED ORDER — ONDANSETRON 4 MG TAB, RAPID DISSOLVE
4 mg | ORAL | Status: AC
Start: 2016-10-03 — End: 2016-10-03
  Administered 2016-10-03: 20:00:00 via ORAL

## 2016-10-03 MED ORDER — LIDOCAINE 5 % (700 MG/PATCH) ADHESIVE PATCH
5 % | CUTANEOUS | 0 refills | Status: DC
Start: 2016-10-03 — End: 2017-02-23

## 2016-10-03 MED ORDER — LIDOCAINE 5 % (700 MG/PATCH) ADHESIVE PATCH
5 % | CUTANEOUS | Status: DC
Start: 2016-10-03 — End: 2016-10-03

## 2016-10-03 MED ORDER — ONDANSETRON 4 MG TAB, RAPID DISSOLVE
4 mg | ORAL_TABLET | ORAL | 0 refills | Status: AC
Start: 2016-10-03 — End: 2016-10-03

## 2016-10-03 MED FILL — LIDOCAINE 5 % (700 MG/PATCH) ADHESIVE PATCH: 5 % | CUTANEOUS | Qty: 1

## 2016-10-03 MED FILL — ONDANSETRON 4 MG TAB, RAPID DISSOLVE: 4 mg | ORAL | Qty: 1

## 2016-10-03 MED FILL — TRAMADOL 50 MG TAB: 50 mg | ORAL | Qty: 1

## 2016-10-03 NOTE — ED Triage Notes (Signed)
Patient complaining of burning with urinating and nausea.

## 2016-10-03 NOTE — ED Provider Notes (Signed)
HPI Comments: Cameron Perry is a 25 y.o. male who presents to the ED with complaint of right flank pain and nausea for 3 days. He also complains of polyuria. No difficulty urinating, dysuria, penile pain, penile discharge, fever, chills, abd pain, v/d. Pt had a UTI once when he was a child, no hx of kidney stones.       Patient is a 25 y.o. male presenting with nausea and flank pain. The history is provided by the patient.   Nausea    This is a new problem. The problem occurs 5 to 10 times per day. The problem has not changed since onset.There has been no fever. Pertinent negatives include no chills, no fever, no sweats, no abdominal pain, no diarrhea, no headaches, no arthralgias, no myalgias, no cough, no URI and no headaches. The patient is not pregnant.  Flank Pain    This is a new problem. The problem has not changed since onset.The problem occurs constantly. Patient reports not work related injury.The pain is associated with no known injury. The pain is present in the lower back. The pain is moderate. Pertinent negatives include no chest pain, no fever, no numbness, no weight loss, no headaches, no abdominal pain, no abdominal swelling, no bowel incontinence, no perianal numbness, no bladder incontinence, no dysuria, no pelvic pain, no leg pain, no paresthesias, no paresis, no tingling and no weakness. He has tried nothing for the symptoms.        History reviewed. No pertinent past medical history.    Past Surgical History:   Procedure Laterality Date   ??? HX APPENDECTOMY           History reviewed. No pertinent family history.    Social History     Social History   ??? Marital status: SINGLE     Spouse name: N/A   ??? Number of children: N/A   ??? Years of education: N/A     Occupational History   ??? Not on file.     Social History Main Topics   ??? Smoking status: Current Every Day Smoker     Packs/day: 0.25   ??? Smokeless tobacco: Never Used   ??? Alcohol use No   ??? Drug use: No   ??? Sexual activity: Not on file      Other Topics Concern   ??? Not on file     Social History Narrative         ALLERGIES: Nsaids (non-steroidal anti-inflammatory drug)    Review of Systems   Constitutional: Negative for chills, fatigue, fever and weight loss.   HENT: Negative for congestion, rhinorrhea, sinus pressure and sore throat.    Eyes: Negative for redness and visual disturbance.   Respiratory: Negative for cough, chest tightness and shortness of breath.    Cardiovascular: Negative for chest pain and palpitations.   Gastrointestinal: Positive for nausea. Negative for abdominal pain, blood in stool, bowel incontinence, constipation, diarrhea and vomiting.   Genitourinary: Positive for flank pain and frequency. Negative for bladder incontinence, difficulty urinating, discharge, dysuria, hematuria, pelvic pain, penile pain and urgency.   Musculoskeletal: Negative for arthralgias and myalgias.   Neurological: Negative for tingling, syncope, weakness, numbness, headaches and paresthesias.   Psychiatric/Behavioral: Negative for confusion.       Vitals:    10/03/16 1443   BP: 105/65   Pulse: 89   Resp: 18   Temp: 98.4 ??F (36.9 ??C)   SpO2: 98%   Weight: 65.8 kg (145 lb)  Height: 5\' 3"  (1.6 m)            Physical Exam   Constitutional: He is oriented to person, place, and time. He appears well-developed and well-nourished. No distress.   HENT:   Head: Normocephalic and atraumatic.   Eyes: Conjunctivae are normal. Pupils are equal, round, and reactive to light. Right eye exhibits no discharge. Left eye exhibits no discharge. No scleral icterus.   Neck: Normal range of motion. Neck supple. No JVD present.   Cardiovascular: Normal rate, regular rhythm, normal heart sounds and intact distal pulses.  Exam reveals no gallop and no friction rub.    No murmur heard.  Pulmonary/Chest: Effort normal and breath sounds normal. No respiratory distress. He has no wheezes. He has no rales.   Abdominal: Soft. Bowel sounds are normal. He exhibits no distension and no  mass. There is tenderness (right flank). There is no rebound and no guarding.   Genitourinary: Testes normal. Uncircumcised.   Musculoskeletal: Normal range of motion. He exhibits no edema.   Neurological: He is alert and oriented to person, place, and time. No cranial nerve deficit. Coordination normal.   Skin: Skin is warm and dry. No rash noted. He is not diaphoretic.   Psychiatric: He has a normal mood and affect. His behavior is normal.   Nursing note and vitals reviewed.       MDM  Number of Diagnoses or Management Options  Flank pain:   Nausea:   Diagnosis management comments: 25 y.o. male with right flank pain x3 days. Likely UTI.       Plan:     UA  Lidoderm Patch, Zofran  -----  3:15 PM  Documented by Boris LowneEnna Wedding, acting as a scribe for Dr. Koleen Distanceeginald M Aybree Lanyon, MD      PROVIDER ATTESTATION:  8:37 AM    The entirety of this note, signed by me, accurately reflects all works, treatments, procedures, and medical decision making performed by me, Koleen Distanceeginald M Amberlea Spagnuolo, MD.            Patient Progress  Patient progress: stable    ED Course     Lab Data:  Labs Reviewed   URINALYSIS W/ RFLX MICROSCOPIC - Abnormal; Notable for the following:        Result Value    Protein TRACE (*)     All other components within normal limits         ED Course:   UA negative for evidence of UTI.  Pt discharged with Rx Zofran, and diagnosis likely a muscle strain.  Pt instructed to follow-up with Dr. Henreitta Ceaauda      Procedures

## 2016-10-03 NOTE — ED Notes (Signed)
Lidocaine patch place on pt lower back. Zofran given. No new orders. Pt awaiting results from specimen. Will cont to monitor.

## 2016-10-03 NOTE — ED Notes (Signed)
Pt given DC instructions. Pt stated he feels better. Pt DC

## 2017-02-12 ENCOUNTER — Emergency Department: Payer: Self-pay

## 2017-02-12 ENCOUNTER — Emergency Department: Payer: Medicaid HMO

## 2017-02-12 ENCOUNTER — Emergency Department
Admission: EM | Admit: 2017-02-12 | Discharge: 2017-02-12 | Disposition: A | Discharge status: Home / Self Care | Payer: Medicaid HMO | Attending: Emergency Medicine | Admitting: Emergency Medicine

## 2017-02-12 DIAGNOSIS — K5090 Crohn's disease, unspecified, without complications: Secondary | ICD-10-CM | POA: Insufficient documentation

## 2017-02-12 DIAGNOSIS — R109 Unspecified abdominal pain: Secondary | ICD-10-CM

## 2017-02-12 DIAGNOSIS — R1030 Lower abdominal pain, unspecified: Secondary | ICD-10-CM | POA: Insufficient documentation

## 2017-02-12 HISTORY — DX: Crohn's disease, unspecified, without complications: K50.90

## 2017-02-12 HISTORY — DX: Essential (primary) hypertension: I10

## 2017-02-12 LAB — VH URINALYSIS WITH MICROSCOPIC
Bilirubin, UA: NEGATIVE
Blood, UA: NEGATIVE
Glucose, UA: NEGATIVE mg/dL
Ketones UA: NEGATIVE mg/dL
Leukocyte Esterase, UA: NEGATIVE Leu/uL
Nitrite, UA: NEGATIVE
Protein, UR: NEGATIVE mg/dL
RBC, UA: 2 /hpf (ref 0–4)
Urine Specific Gravity: 1.016 (ref 1.001–1.040)
Urobilinogen, UA: NORMAL mg/dL
WBC, UA: <1 /hpf (ref 0–4)
pH, Urine: 7 pH (ref 5.0–8.0)

## 2017-02-12 LAB — VH URINE DRUG SCREEN
Amphetamine: NEGATIVE
Barbiturates: NEGATIVE
Buprenorphine, Urine: NEGATIVE
Cannabinoids: POSITIVE — AB
Cocaine: NEGATIVE
Opiates: NEGATIVE
Phencyclidine: NEGATIVE
Urine Benzodiazepines: NEGATIVE
Urine Creatinine Random: 76.22 mg/dL
Urine Ecstasy Screen: NEGATIVE
Urine Fentanyl Screen: NEGATIVE
Urine Methadone Screen: NEGATIVE
Urine Oxycodone: NEGATIVE
Urine Specific Gravity: 1.014 (ref 1.001–1.040)
pH, Urine: 7.1 pH (ref 5.0–8.0)

## 2017-02-12 LAB — BASIC METABOLIC PANEL
Anion Gap: 11.6 mMol/L (ref 7.0–18.0)
BUN / Creatinine Ratio: 16 Ratio (ref 10.0–30.0)
BUN: 15 mg/dL (ref 7–22)
CO2: 28.3 mMol/L (ref 20.0–30.0)
Calcium: 9.6 mg/dL (ref 8.5–10.5)
Chloride: 105 mMol/L (ref 98–110)
Creatinine: 0.94 mg/dL (ref 0.80–1.30)
EGFR: 110 mL/min/{1.73_m2} (ref 60–150)
Glucose: 75 mg/dL (ref 71–99)
Osmolality Calc: 281 mOsm/kg (ref 275–300)
Potassium: 3.9 mMol/L (ref 3.5–5.3)
Sodium: 141 mMol/L (ref 136–147)

## 2017-02-12 LAB — CBC AND DIFFERENTIAL
Basophils %: 1.3 % (ref 0.0–3.0)
Basophils Absolute: 0.1 10*3/uL (ref 0.0–0.3)
Eosinophils %: 1.2 % (ref 0.0–7.0)
Eosinophils Absolute: 0.1 10*3/uL (ref 0.0–0.8)
Hematocrit: 40.9 % (ref 39.0–52.5)
Hemoglobin: 13.7 gm/dL (ref 13.0–17.5)
Lymphocytes Absolute: 3.8 10*3/uL (ref 0.6–5.1)
Lymphocytes: 45.7 % (ref 15.0–46.0)
MCH: 31 pg (ref 28–35)
MCHC: 34 gm/dL (ref 32–36)
MCV: 94 fL (ref 80–100)
MPV: 6.4 fL (ref 6.0–10.0)
Monocytes Absolute: 0.5 10*3/uL (ref 0.1–1.7)
Monocytes: 5.5 % (ref 3.0–15.0)
Neutrophils %: 46.3 % (ref 42.0–78.0)
Neutrophils Absolute: 3.8 10*3/uL (ref 1.7–8.6)
PLT CT: 309 10*3/uL (ref 130–440)
RBC: 4.38 10*6/uL (ref 4.00–5.70)
RDW: 11.5 % (ref 11.0–14.0)
WBC: 8.2 10*3/uL (ref 4.0–11.0)

## 2017-02-12 MED ORDER — METHYLPREDNISOLONE SODIUM SUCC 125 MG IJ SOLR
40.0000 mg | Freq: Once | INTRAMUSCULAR | Status: AC
Start: 2017-02-12 — End: 2017-02-12
  Administered 2017-02-12: 40 mg via INTRAVENOUS

## 2017-02-12 MED ORDER — MORPHINE SULFATE 4 MG/ML IJ/IV SOLN (WRAP)
INTRAVENOUS | Status: AC
Start: 2017-02-12 — End: ?
  Filled 2017-02-12: qty 1

## 2017-02-12 MED ORDER — MORPHINE SULFATE 4 MG/ML IJ/IV SOLN (WRAP)
4.0000 mg | Freq: Once | Status: AC
Start: 2017-02-12 — End: 2017-02-12
  Administered 2017-02-12: 4 mg via INTRAVENOUS

## 2017-02-12 MED ORDER — ONDANSETRON 8 MG PO TBDP
8.0000 mg | ORAL_TABLET | Freq: Two times a day (BID) | ORAL | 0 refills | Status: DC | PRN
Start: 2017-02-12 — End: 2018-04-21

## 2017-02-12 MED ORDER — ONDANSETRON HCL 4 MG/2ML IJ SOLN
INTRAMUSCULAR | Status: AC
Start: 2017-02-12 — End: ?
  Filled 2017-02-12: qty 2

## 2017-02-12 MED ORDER — PROMETHAZINE HCL 25 MG/ML IJ SOLN
INTRAMUSCULAR | Status: AC
Start: 2017-02-12 — End: ?
  Filled 2017-02-12: qty 1

## 2017-02-12 MED ORDER — DIPHENHYDRAMINE HCL 50 MG/ML IJ SOLN
12.5000 mg | Freq: Once | INTRAMUSCULAR | Status: AC
Start: 2017-02-12 — End: 2017-02-12
  Administered 2017-02-12: 12.5 mg via INTRAVENOUS

## 2017-02-12 MED ORDER — IOHEXOL 240 MG/ML IJ SOLN
50.0000 mL | Freq: Once | INTRAMUSCULAR | Status: AC
Start: 2017-02-12 — End: 2017-02-12
  Administered 2017-02-12: 50 mL via ORAL

## 2017-02-12 MED ORDER — HYDROCODONE-ACETAMINOPHEN 5-325 MG PO TABS
1.0000 | ORAL_TABLET | Freq: Once | ORAL | Status: AC
Start: 2017-02-12 — End: 2017-02-12
  Administered 2017-02-12: 1 via ORAL

## 2017-02-12 MED ORDER — IOHEXOL 350 MG/ML IV SOLN
100.0000 mL | Freq: Once | INTRAVENOUS | Status: AC | PRN
Start: 2017-02-12 — End: 2017-02-12
  Administered 2017-02-12: 100 mL via INTRAVENOUS

## 2017-02-12 MED ORDER — PROMETHAZINE HCL 25 MG/ML IJ SOLN
12.5000 mg | Freq: Once | INTRAMUSCULAR | Status: AC
Start: 2017-02-12 — End: 2017-02-12
  Administered 2017-02-12: 12.5 mg via INTRAVENOUS

## 2017-02-12 MED ORDER — METHYLPREDNISOLONE SODIUM SUCC 40 MG IJ SOLR
INTRAMUSCULAR | Status: AC
Start: 2017-02-12 — End: ?
  Filled 2017-02-12: qty 1

## 2017-02-12 MED ORDER — SODIUM CHLORIDE 0.9 % IV BOLUS
1000.0000 mL | Freq: Once | INTRAVENOUS | Status: AC
Start: 2017-02-12 — End: 2017-02-12
  Administered 2017-02-12: 1000 mL via INTRAVENOUS

## 2017-02-12 MED ORDER — HYDROCODONE-ACETAMINOPHEN 5-325 MG PO TABS
ORAL_TABLET | ORAL | Status: AC
Start: 2017-02-12 — End: ?
  Filled 2017-02-12: qty 1

## 2017-02-12 MED ORDER — DIPHENHYDRAMINE HCL 50 MG/ML IJ SOLN
INTRAMUSCULAR | Status: AC
Start: 2017-02-12 — End: ?
  Filled 2017-02-12: qty 1

## 2017-02-12 MED ORDER — ONDANSETRON HCL 4 MG/2ML IJ SOLN
4.0000 mg | Freq: Once | INTRAMUSCULAR | Status: AC
Start: 2017-02-12 — End: 2017-02-12
  Administered 2017-02-12: 4 mg via INTRAVENOUS

## 2017-02-12 MED ORDER — PREDNISONE 10 MG PO TABS
40.0000 mg | ORAL_TABLET | Freq: Every day | ORAL | 0 refills | Status: DC
Start: 2017-02-12 — End: 2018-04-21

## 2017-02-12 NOTE — ED Notes (Signed)
Pt in CT at this time.

## 2017-02-12 NOTE — ED Provider Notes (Signed)
Physician/Midlevel provider first contact with patient: 02/12/17 0630         EMERGENCY DEPARTMENT HISTORY AND PHYSICAL EXAM      Date Time: 02/12/17 6:48 AM  Patient Name: Ricky [name],Ricky [name]  Attending Physician: Marvene Staff, MD    Assessment/Plan:   Abdominal pain  Exacerbation of Crohn's disease  No other acute findings on CT (aside thickening of terminal ileum)      MDM     The patient presents with abdominal pain without signs of peritonitis or other life-threatening or serious etiology. Many etiologies of the pain were considered such as appendicitis, colitis, exacerbation of his known Crohn's disease.The patient was given precautions and instructions to return if symptoms worsen or change in any way for re-evaluation.  Follow-up with a GI specialist was advised. He lives in [place] and will look for a doctor there. I will refer him locally if he cannot get one. I will treat with Prednisone 40 mg x 1 week followed by a 3 week taper.    History of Presenting Illness:   History provided by:  Patient  Ricky [name] is a 26 y.o. male     Patient presents for abdominal pain.  Context: Patient states sharp abdominal pain that began 2 days ago (04/03) and has not improved since onset.  He states pain below the navel that is "intense".  He also states 4-5 episodes of diarrhea this morning (04/05).  Patient states nausea but denies any emesis.  Denies any blood in stool.  He states h/o Crohn's disease but has not had a flare-up for approx 4 years.  He states the pain is worse than he remembers.  He denies any fevers, chills, cough, rhinorrhea or sore throat.  Location: abd   Quality: sharp, constant  Radiation: none  Severity: 8/10  Duration: 2 days (04/03)  Associated signs/symptoms: nausea, diarrhea  Exacerbating/mitigating factors: none      Past Medical History:     Past Medical History:   Diagnosis Date   . Calculus of kidney    . Crohn disease    . Hypertension        Past Surgical History:      Past Surgical History:   Procedure Laterality Date   . CHOLECYSTECTOMY     . LAPAROSCOPIC, APPENDECTOMY N/A 07/05/2015    Procedure: LAPAROSCOPIC, APPENDECTOMY;  Surgeon: Rosaria Ferries, MD;  Location: [facility];  Service: General;  Laterality: N/A;   . THYROIDECTOMY         Family History:   No family history on file.    Social History:     Social History     Social History   . Marital status: Single     Spouse name: N/A   . Number of children: N/A   . Years of education: N/A     Social History Main Topics   . Smoking status: Current Every Day Smoker     Packs/day: 0.50     Years: 10.00     Types: Cigarettes   . Smokeless tobacco: Never Used   . Alcohol use No   . Drug use: Yes     Types: Marijuana   . Sexual activity: Not on file     Other Topics Concern   . Not on file     Social History Narrative   . No narrative on file       Allergies:     Allergies   Allergen Reactions   .  Nsaids        Medications:     Previous Medications    No medications on file        Review of Systems:   Constitutional:  No fever.  No chills    Ears:  No ear pain.    Nose:  No congestion.  No discharge    Throat:  No sore throat.  No difficulty swallowing.    Cardiovascular: No chest pain.      Respiratory: No cough.  No shortness of breath.    GI:  +Abdominal pain.  +Nausea.  No vomiting.  +Diarrhea.  No blood in stool.    GU:  No dysuria.    Neurological:  No headache.  No weakness.    Musculoskeletal:  No pain.    Skin:  No rash.  No skin lesions.    All other systems reviewed and negative except as above, pertinent findings in HPI.      Physical Exam:   Blood pressure 115/81, pulse (!) 101, temperature 98 [name] (36.7 C), temperature source Tympanic, resp. rate 16, height 1.6 m, weight 57.9 kg, SpO2 98 %.  Constitutional:  Vitals signs reviewed. Well-appearing.  Well-nourished. No acute distress.    Head:  Atraumatic, normocephalic    Eyes:  Pupils equal, round.  Conjunctiva no injection or erythema    Nose:  Mucous membranes  moist.  No discharge.     Mouth & Throat:   No erythema.  No exudates     Neck:  Supple, non tender.  No cervical lymphadenopathy.    Respiratory:  Breath sounds normal.  No distress    Chest:  Non tender    Cardiovascular:  Heart regular rate and rhythm.  No murmurs/gallops/rubs.  Pulses +2 bilaterally    Abdomen:  Soft. Tender in lower abdomen. No distension. No bruits. Normal bowel sounds    Back:  No CVA tenderness bilaterally    Extremities:  Full range of motion.  No edema.  No cyanosis.  No deformity.    Skin:  Warm.  Dry.  No pallor.  No rashes.  No lesions.  No bruises    Neurological:  Alert. Oriented to person,place, time.  GCS 15.  No focal motor deficits.     Psychiatric:  Normal affect.  No anxiety.  No depression.  No agitation.      Lab Results     Labs Reviewed   [facility] URINE DRUG SCREEN - Abnormal; Notable for the following:        Result Value    Cannabinoids Positive (*)     All other components within normal limits   BASIC METABOLIC PANEL   CBC AND DIFFERENTIAL   VH URINALYSIS WITH MICROSCOPIC       Radiology Results     CT Abdomen Pelvis W IV And PO Cont   Final Result   No acute finding.   Mild wall thickening of the terminal ileum.      ReadingStation:[id number]          Labs and Radiological Studies Reviewed    Final Impression     Final diagnoses:   Abdominal pain, unspecified abdominal location   Exacerbation of Crohn's disease without complication       Disposition     ED Disposition     ED Disposition Condition Date/Time Comment    Discharge  Thu Feb 12, 2017  9:24 AM Ricky [name] discharge to home/self care.    Condition at disposition: Stable  Follow-Up Provider   Jayme Cloud, MD  [address]  [place] [address]  [phone]    Call            ATTESTATIONS    The documentation recorded by my scribe, [place], accurately reflects the services I personally performed and the decisions made by me.  Suleyma Wafer Ames Dura, MD                Kedra Mcglade Ames Dura,  MD         Marvene Staff, MD  02/12/17 [phone]

## 2017-02-12 NOTE — Discharge Instructions (Signed)
Crohn's Disease    Crohn's disease isinflammation of the intestinal tract that comes and goes in flare-ups. This is a chronic(long-term)illness. During a flare-up, intense abdominal pain and fever may be felt. Mucus, blood, or pus may appear in the stool. Between flare ups, inflammation lessens and there usually are no symptoms. Crohn's disease is a form of inflammatory bowel disease (IBD).  Symptoms of Crohn's disease may include:  Abdominal cramps and pain  Diarrhea, usually bloody, alternating with constipation  Mucus in stools  Rectal bleeding  Rectal pain  Fever  Low energy  Decreased appetite and weight loss  No one knows what exactly causes Crohn's disease, and there is no cure. The goal of treatment is to control and relieve symptoms and prevent complications, so you can lead a full and active life. No single treatment works for everyone, but many things can be done to help.  Diet  Foods did not cause your Crohn's, but they can affect it. Unfortunately, there is no one diet that works for everyone. Below aresome things to try.Keep a food log to figure out what you are sensitive to.  Eat more slowly and smaller amounts at a time, but more often. Remember, you can always eat more, but cannot eat less once you've eaten too much.  High fiber foods are complicated. While they may help constipation they can make the bloating, cramping, gas, and diarrhea worse.  Try avoiding dairy products, sometimes this helps  Try cutting out foods that are high in fat and fatty meats  Eat less sugar  Bloating or passing excess gas may be controlled. Be careful with "gassy" vegetables and fruits like beans, cabbage, broccoli, and cauliflower.  Be careful of carbonated beverages and fruit juices. They can make the bloating and diarrhea worse.  Caffeine, alcohol, and stimulants may worsen symptoms. These include coffee, tea, sodas, energy drinks, and chocolate.  Lifestyle  Although stress does not cause Crohn's, it is often  a factor in flare-ups. It can also affect how you feel about and cope with your condition.  Look for factors that seem to worsen your symptoms such as stress and emotions.  Counseling can often help relieve stress. So canself-help measures such as exercise, yoga, and meditation.  Depression can be a part of this illness and antidepressants may be prescribed. This may actually help with diarrhea, constipation, and cramping, as well as depression.  Smoking doesn't cause Crohn's, but can make the symptoms worse.  Medicines  Your healthcare provider may prescribe medicines. If so, take them as directed. For acute flare-ups, prescription medicines can be prescribed. Contact your provider if you need this.  Ask your healthcare provider before taking any antidiarrheal medicines.  Avoid anti-inflammatory medicines like ibuprofen or naproxen.  Consider nutritional supplements. This is especially true if the diarrhea is prolonged, or you aren't eating or are losing weight.  Follow-up care  Follow up with your healthcare provider, oras advised.If a stool samplewastaken or cultures were done, you will be told if your treatment needs to change.Call as directedfor the results.  Support  Support groups for persons Crohn's disease can be a source of useful information on how others are coping with this illness. They are available in person, on the phone, or via the Internet. Contact the following resources for more information.  [employer] 800-932-2423 www.ccfa.org  National Digestive Diseases Information Clearinghouse (NDDIC) 800-891-5389 www.digestive.niddk.nih.gov  When to seek medical advice  Call your healthcare provider right awayif any   of these occur:  Fever of 100.4F(38C) or higher, or as directed by your healthcare provider  Abdominal pain that doesn't get better when you take the usual measures  Mucus, pus, or blood in the stool (dark or bright red)  Repeated  vomiting  Abdominal swelling and pain that doesn't go away after a few hours  Call 911  Call 911 if any of these occur:  Trouble breathing  Confusion  Extreme sleepiness or trouble waking up  Fainting or loss of consciousness  Rapid heart rate  Date Last Reviewed: 07/10/2014   2000-2016 [employer] [address], [place] [address]. All rights reserved. This information is not intended as a substitute for professional medical care. Always follow your healthcare professional's instructions.        Abdominal Pain    Abdominal pain is pain in the stomach or belly area. Everyone has this pain from time to time. In many cases it goes away on its own. But abdominal pain can sometimes be due to a serious problem, such as appendicitis. So it's important to know when to seek help.  Causes of abdominal pain  There are many possible causes of abdominal pain. Common causes in adults include:   Constipation, diarrhea, or gas   Stomach acid flowing back up into the esophagus (acid reflux or heartburn)   Severe acid reflux, called GERD (gastroesophageal reflux disease)   A sore in the lining of the stomach or small intestine (peptic ulcer)   Inflammation of the gallbladder, liver,or pancreas   Gallstones or kidney stones   Appendicitis   Intestinal blockage   An internal organ pushing through a muscle or other tissue (hernia)   Urinary tract infections   In women, menstrual cramps, fibroids, or endometriosis   Inflammation or infection of the intestines  Diagnosing the cause of abdominal pain  Your healthcare provider will do a physical exam help find the cause of your pain. If needed, tests will be ordered. Belly pain has many possible causes. So it can be hard to find the reason for your pain. Giving details about your pain can help. Tell your provider where and when you feel the pain, and what makes it better or worse. Also let your provider know if you have other symptoms such  as:   Fever   Tiredness   Upset stomach (nausea)   Vomiting   Changes in bathroom habits  Treating abdominal pain  Some causes of pain need emergency medical treatment right away. These include appendicitis or a bowel blockage. Other problems can be treated with rest, fluids, or medicines. Your healthcare provider can give you specific instructions for treatment or self-care based on what is causing your pain.  If you have vomiting or diarrhea,sip water or other clear fluids. When you are ready to eat solid foods again, start with small amounts of easy-to-digest, low-fat foods. These include apple sauce, toast, or crackers.   When to seek medical care  Call 911or go to the hospital right away if you:   Can't pass stool and are vomiting   Are vomiting blood or have bloody diarrhea or black, tarry diarrhea   Have chest, neck, or shoulder pain   Feel like you might pass out   Have pain in your shoulder blades with nausea   Have sudden, severe belly pain   Have new, severepain unlike any you have felt before   Have a belly that is rigid, hard, and tender to touch    Call your healthcare provider if you have:   Pain for more than5days   Bloating for more than 2days   Diarrhea for more than5days   A fever of 100.4F (38C) or higher, or as directed by your healthcare provider   Pain that gets worse   Weight loss for no reason   Continued lack of appetite   Blood in your stool  How to prevent abdominal pain  Here are some tips to help prevent abdominal pain:   Eat smaller amounts of food at one time.   Avoid greasy, fried, or other high-fat foods.   Avoid foods that give you gas.   Exercise regularly.   Drink plenty of fluids.  To help prevent GERD symptoms:   Quit smoking.   Reduce alcohol and certain foods that increase stomach acid.   Avoid aspirin and over-the-counter pain and fever medicines (NSAIDS or nonsteroidal anti-inflammatory drugs), if possible   Lose extra weight.   Finish  eating at least 2 hours before you go to bed or lie down.   Raise the head of your bed.  Date Last Reviewed: 05/11/2015   2000-2017 [employer] [address], [place] [address]. All rights reserved. This information is not intended as a substitute for professional medical care. Always follow your healthcare professional's instructions.

## 2017-02-12 NOTE — ED Triage Notes (Signed)
Pt with h/o Crohn's diease that has been in remission x 4 years. States he feels he is having a flare, reports low abd pain and diarrhea x 2 days. Some nausea, no vomiting.

## 2017-02-23 DIAGNOSIS — R1084 Generalized abdominal pain: Secondary | ICD-10-CM

## 2017-02-23 NOTE — ED Provider Notes (Signed)
EMERGENCY DEPARTMENT HISTORY AND PHYSICAL EXAM    Date: 02/23/2017  Patient Name: Cameron Perry    History of Presenting Illness     Chief Complaint   Patient presents with   ??? Inflammatory Bowel Disease     Patient has crohn's that has been in remission for about 4 years and he started having diarrhea and abdominal pain for a couple of days.       History Provided By: Patient    HPI: Rhodes Calvert is a 26 y.o. male, pmhx Crohn's Disease, who presents ambulatory to the ED c/o persistent, sharp cramping, lower abdominal pain with associated nausea x couple of days. Pt states his current sxs are similar to a Crohn's flare. Pt notes he does not take any daily medications for Crohn's and has not had a flare in about x5 years. Pt reports his last bowel movement was x3 hours ago. Pt specifically denies any fever, congestion, cough, shortness of breath, chest pain, vomiting, diarrhea, dysuria, or urinary frequency.    PCP: None    PMHx: Significant for Crohn's Disease  PSHx: Significant for appendectomy  Social Hx: +tobacco (.78ION), -EtOH, +Illicit Drugs (Marijuanna)    There are no other complaints, changes, or physical findings at this time.     Current Outpatient Prescriptions   Medication Sig Dispense Refill   ??? dicyclomine (BENTYL) 20 mg tablet Take 1 Tab by mouth every six (6) hours for 20 doses. 20 Tab 0   ??? ondansetron (ZOFRAN ODT) 4 mg disintegrating tablet Take 1 Tab by mouth every eight (8) hours as needed for Nausea. 10 Tab 0       Past History     Past Medical History:  No past medical history on file.    Past Surgical History:  Past Surgical History:   Procedure Laterality Date   ??? HX APPENDECTOMY         Family History:  No family history on file.    Social History:  Social History   Substance Use Topics   ??? Smoking status: Current Every Day Smoker     Packs/day: 0.25   ??? Smokeless tobacco: Never Used   ??? Alcohol use No       Allergies:  Allergies   Allergen Reactions    ??? Nsaids (Non-Steroidal Anti-Inflammatory Drug) Other (comments)     Cant take because he has Crohns.         Review of Systems   Review of Systems   Constitutional: Negative for chills and fever.   HENT: Negative.    Eyes: Negative.    Respiratory: Negative for cough, chest tightness and shortness of breath.    Cardiovascular: Negative for chest pain and leg swelling.   Gastrointestinal: Positive for abdominal pain and nausea. Negative for diarrhea and vomiting.   Endocrine: Negative.    Genitourinary: Negative for difficulty urinating and dysuria.   Musculoskeletal: Negative for myalgias.   Skin: Negative.    Neurological: Negative.    Psychiatric/Behavioral: Negative.    All other systems reviewed and are negative.      Physical Exam   Physical Exam   Constitutional: He is oriented to person, place, and time. He appears well-developed and well-nourished. No distress.   HENT:   Head: Normocephalic and atraumatic.   Nose: Nose normal.   Mouth/Throat: No oropharyngeal exudate.   Eyes: Conjunctivae and EOM are normal. Pupils are equal, round, and reactive to light.   Neck: Normal range of motion. Neck supple. No JVD  present.   Cardiovascular: Normal rate, regular rhythm, normal heart sounds and intact distal pulses.  Exam reveals no friction rub.    No murmur heard.  Pulmonary/Chest: Effort normal and breath sounds normal. No stridor. No respiratory distress. He has no wheezes. He has no rales.   Abdominal: Soft. Bowel sounds are normal. He exhibits no distension. There is tenderness. There is no rebound.       Musculoskeletal: Normal range of motion. He exhibits no tenderness.   Neurological: He is alert and oriented to person, place, and time. No cranial nerve deficit.   Skin: Skin is warm and dry. No rash noted. He is not diaphoretic.   Psychiatric: He has a normal mood and affect. His speech is normal and behavior is normal. Judgment and thought content normal. Cognition and memory are normal.    Nursing note and vitals reviewed.        Diagnostic Study Results     Labs -     Recent Results (from the past 12 hour(s))   URINALYSIS W/ REFLEX CULTURE    Collection Time: 02/23/17 11:23 PM   Result Value Ref Range    Color YELLOW/STRAW      Appearance CLEAR CLEAR      Specific gravity 1.007 1.003 - 1.030      pH (UA) 7.0 5.0 - 8.0      Protein NEGATIVE  NEG mg/dL    Glucose NEGATIVE  NEG mg/dL    Ketone NEGATIVE  NEG mg/dL    Bilirubin NEGATIVE  NEG      Blood NEGATIVE  NEG      Urobilinogen 0.2 0.2 - 1.0 EU/dL    Nitrites NEGATIVE  NEG      Leukocyte Esterase NEGATIVE  NEG      WBC 0-4 0 - 4 /hpf    RBC 0-5 0 - 5 /hpf    Epithelial cells FEW FEW /lpf    Bacteria NEGATIVE  NEG /hpf    UA:UC IF INDICATED CULTURE NOT INDICATED BY UA RESULT CNI      Hyaline cast 0-2 0 - 5 /lpf   LACTIC ACID    Collection Time: 02/23/17 11:23 PM   Result Value Ref Range    Lactic acid 0.6 0.4 - 2.0 MMOL/L   CBC WITH AUTOMATED DIFF    Collection Time: 02/24/17 12:44 AM   Result Value Ref Range    WBC 7.4 4.1 - 11.1 K/uL    RBC 3.58 (L) 4.10 - 5.70 M/uL    HGB 10.9 (L) 12.1 - 17.0 g/dL    HCT 32.5 (L) 36.6 - 50.3 %    MCV 90.8 80.0 - 99.0 FL    MCH 30.4 26.0 - 34.0 PG    MCHC 33.5 30.0 - 36.5 g/dL    RDW 12.0 11.5 - 14.5 %    PLATELET 241 150 - 400 K/uL    MPV 8.7 (L) 8.9 - 12.9 FL    NRBC 0.0 0 PER 100 WBC    ABSOLUTE NRBC 0.00 0.00 - 0.01 K/uL    NEUTROPHILS 52 32 - 75 %    LYMPHOCYTES 41 12 - 49 %    MONOCYTES 6 5 - 13 %    EOSINOPHILS 1 0 - 7 %    BASOPHILS 0 0 - 1 %    IMMATURE GRANULOCYTES 0 0.0 - 0.5 %    ABS. NEUTROPHILS 3.9 1.8 - 8.0 K/UL    ABS. LYMPHOCYTES 3.0 0.8 - 3.5 K/UL  ABS. MONOCYTES 0.4 0.0 - 1.0 K/UL    ABS. EOSINOPHILS 0.1 0.0 - 0.4 K/UL    ABS. BASOPHILS 0.0 0.0 - 0.1 K/UL    ABS. IMM. GRANS. 0.0 0.00 - 0.04 K/UL    DF AUTOMATED     LIPASE    Collection Time: 02/24/17 12:44 AM   Result Value Ref Range    Lipase 107 73 - 161 U/L   METABOLIC PANEL, COMPREHENSIVE    Collection Time: 02/24/17 12:44 AM    Result Value Ref Range    Sodium 145 136 - 145 mmol/L    Potassium 3.5 3.5 - 5.1 mmol/L    Chloride 114 (H) 97 - 108 mmol/L    CO2 25 21 - 32 mmol/L    Anion gap 6 5 - 15 mmol/L    Glucose 77 65 - 100 mg/dL    BUN 15 6 - 20 MG/DL    Creatinine 0.70 0.70 - 1.30 MG/DL    BUN/Creatinine ratio 21 (H) 12 - 20      GFR est AA >60 >60 ml/min/1.36m    GFR est non-AA >60 >60 ml/min/1.741m   Calcium 8.2 (L) 8.5 - 10.1 MG/DL    Bilirubin, total 0.5 0.2 - 1.0 MG/DL    ALT (SGPT) 16 12 - 78 U/L    AST (SGOT) 17 15 - 37 U/L    Alk. phosphatase 42 (L) 45 - 117 U/L    Protein, total 5.9 (L) 6.4 - 8.2 g/dL    Albumin 3.3 (L) 3.5 - 5.0 g/dL    Globulin 2.6 2.0 - 4.0 g/dL    A-G Ratio 1.3 1.1 - 2.2         Radiologic Studies -   No orders to display     CT Results  (Last 48 hours)    None        CXR Results  (Last 48 hours)    None            Medical Decision Making   I am the first provider for this patient.    I reviewed the vital signs, available nursing notes, past medical history, past surgical history, family history and social history.    Vital Signs-Reviewed the patient's vital signs.  Patient Vitals for the past 12 hrs:   Temp Pulse Resp BP SpO2   02/24/17 0130 - 82 16 109/74 98 %   02/24/17 0100 - 71 17 111/73 97 %   02/24/17 0050 - 73 18 113/76 98 %   02/24/17 0015 - 74 16 114/77 97 %   02/24/17 0000 - 76 17 113/72 97 %   02/23/17 2132 97.7 ??F (36.5 ??C) 83 16 (!) 128/106 98 %       Pulse Oximetry Analysis - 98% on RA    Cardiac Monitor:   Rate: 83 bpm  Rhythm: Normal Sinus Rhythm      Records Reviewed: Nursing Notes and Old Medical Records    Provider Notes (Medical Decision Making):     DDX:  crohns flare, constipation, pancreatitis, appy    Plan:  Labs, if wbc count elevated, will consider CT abd/pelvis,     Impression:  Abdominal pain    ED Course:   Initial assessment performed. The patients presenting problems have been discussed, and they are in agreement with the care plan formulated and  outlined with them.  I have encouraged them to ask questions as they arise throughout their visit.    I reviewed  our electronic medical record system for any past medical records that were available that may contribute to the patients current condition, the nursing notes and and vital signs from today's visit    Nursing notes will be reviewed as they become available in realtime while the pt has been in the ED.  Glean Hess, MD    I have spent 3-7 minutes discussing the medical risks of prolonged smoking habits and advised the patient of the benefits of the cessation of smoking, providing specific suggestions on how to quit.  Glean Hess, MD    PROGRESS NOTE:  1:30 AM  Pt reevaluated. Pt found resting comfortably in stretcher. Reviewed resulted labs and UA, will plan for d/c.   Written by Quenton Fetter, ED Scribe, as dictated by Glean Hess, MD    1:34 AM  Progress note:  Pt noted to be feeling better, ready for discharge. Discussed lab findings with pt and/or family, specifically noting negative workup. Will d/c with Zofran and Bentyl. Pt will follow up as instructed. All questions have been answered, pt voiced understanding and agreement with plan.     If narcotics were prescribed, pt was advised not to drive or operate heavy machinery. If abx were prescribed, pt advised that diarrhea and rash are possible side effects of the medications.     Specific return precautions provided in addition to instructions for pt to return to the ED immediately should sx worsen at any time.  Glean Hess, MD      Critical Care Time:     None     PLAN:  1.   Current Discharge Medication List      START taking these medications    Details   dicyclomine (BENTYL) 20 mg tablet Take 1 Tab by mouth every six (6) hours for 20 doses.  Qty: 20 Tab, Refills: 0      ondansetron (ZOFRAN ODT) 4 mg disintegrating tablet Take 1 Tab by mouth every eight (8) hours as needed for Nausea.  Qty: 10 Tab, Refills: 0           2.    Follow-up Information     Follow up With Details Comments Contact Info    Christin Bach, MD Schedule an appointment as soon as possible for a visit  [address]  [place] [address]  [phone]      [facility]  As needed [address]  [place]  [phone]        Return to ED if worse     Disposition:    1:35 AM   The patient's results have been reviewed with family and/or caregiver. They verbally convey their understanding and agreement of the patient's signs, symptoms, diagnosis, treatment and prognosis and additionally agree to follow up as recommended in the discharge instructions or to return to the Emergency Room should the patient's condition change prior to their follow-up appointment. The family and/or caregiver verbally agrees with the care-plan and all of their questions have been answered. The discharge instructions have also been provided to the them with educational information regarding the patient's diagnosis as well a list of reasons why the patient would want to return to the ER prior to their follow-up appointment should their condition change.  Glean Hess, MD      Diagnosis     Clinical Impression:   1. Abdominal pain, generalized    2. History of Crohn's disease  Attestations:    This note is prepared by Quenton Fetter, acting as Scribe for Glean Hess, MD    Glean Hess, MD : The scribe's documentation has been prepared under my direction and personally reviewed by me in its entirety. I confirm that the note above accurately reflects all work, treatment, procedures, and medical decision making performed by me.           This note will not be viewable in [facility].

## 2017-02-24 ENCOUNTER — Inpatient Hospital Stay
Admit: 2017-02-24 | Discharge: 2017-02-24 | Disposition: A | Discharge status: Home / Self Care | Payer: Medicaid - Out of State | Attending: Emergency Medicine

## 2017-02-24 LAB — URINALYSIS W/ REFLEX CULTURE
Bacteria: NEGATIVE /hpf
Bilirubin: NEGATIVE
Blood: NEGATIVE
Glucose: NEGATIVE mg/dL
Ketone: NEGATIVE mg/dL
Leukocyte Esterase: NEGATIVE
Nitrites: NEGATIVE
Protein: NEGATIVE mg/dL
Specific gravity: 1.007 (ref 1.003–1.030)
Urobilinogen: 0.2 EU/dL (ref 0.2–1.0)
pH (UA): 7 (ref 5.0–8.0)

## 2017-02-24 LAB — CBC WITH AUTOMATED DIFF
ABS. BASOPHILS: 0 10*3/uL (ref 0.0–0.1)
ABS. EOSINOPHILS: 0.1 10*3/uL (ref 0.0–0.4)
ABS. IMM. GRANS.: 0 10*3/uL (ref 0.00–0.04)
ABS. LYMPHOCYTES: 3 10*3/uL (ref 0.8–3.5)
ABS. MONOCYTES: 0.4 10*3/uL (ref 0.0–1.0)
ABS. NEUTROPHILS: 3.9 10*3/uL (ref 1.8–8.0)
ABSOLUTE NRBC: 0 10*3/uL (ref 0.00–0.01)
BASOPHILS: 0 % (ref 0–1)
EOSINOPHILS: 1 % (ref 0–7)
HCT: 32.5 % — ABNORMAL LOW (ref 36.6–50.3)
HGB: 10.9 g/dL — ABNORMAL LOW (ref 12.1–17.0)
IMMATURE GRANULOCYTES: 0 % (ref 0.0–0.5)
LYMPHOCYTES: 41 % (ref 12–49)
MCH: 30.4 PG (ref 26.0–34.0)
MCHC: 33.5 g/dL (ref 30.0–36.5)
MCV: 90.8 FL (ref 80.0–99.0)
MONOCYTES: 6 % (ref 5–13)
MPV: 8.7 FL — ABNORMAL LOW (ref 8.9–12.9)
NEUTROPHILS: 52 % (ref 32–75)
NRBC: 0 PER 100 WBC
PLATELET: 241 10*3/uL (ref 150–400)
RBC: 3.58 M/uL — ABNORMAL LOW (ref 4.10–5.70)
RDW: 12 % (ref 11.5–14.5)
WBC: 7.4 10*3/uL (ref 4.1–11.1)

## 2017-02-24 LAB — METABOLIC PANEL, COMPREHENSIVE
A-G Ratio: 1.3 (ref 1.1–2.2)
ALT (SGPT): 16 U/L (ref 12–78)
AST (SGOT): 17 U/L (ref 15–37)
Albumin: 3.3 g/dL — ABNORMAL LOW (ref 3.5–5.0)
Alk. phosphatase: 42 U/L — ABNORMAL LOW (ref 45–117)
Anion gap: 6 mmol/L (ref 5–15)
BUN/Creatinine ratio: 21 — ABNORMAL HIGH (ref 12–20)
BUN: 15 MG/DL (ref 6–20)
Bilirubin, total: 0.5 MG/DL (ref 0.2–1.0)
CO2: 25 mmol/L (ref 21–32)
Calcium: 8.2 MG/DL — ABNORMAL LOW (ref 8.5–10.1)
Chloride: 114 mmol/L — ABNORMAL HIGH (ref 97–108)
Creatinine: 0.7 MG/DL (ref 0.70–1.30)
GFR est AA: >60 mL/min/{1.73_m2} (ref >60)
GFR est non-AA: >60 mL/min/{1.73_m2} (ref >60)
Globulin: 2.6 g/dL (ref 2.0–4.0)
Glucose: 77 mg/dL (ref 65–100)
Potassium: 3.5 mmol/L (ref 3.5–5.1)
Protein, total: 5.9 g/dL — ABNORMAL LOW (ref 6.4–8.2)
Sodium: 145 mmol/L (ref 136–145)

## 2017-02-24 LAB — LACTIC ACID: Lactic acid: 0.6 MMOL/L (ref 0.4–2.0)

## 2017-02-24 LAB — LIPASE: Lipase: 107 U/L (ref 73–393)

## 2017-02-24 MED ORDER — ONDANSETRON 4 MG TAB, RAPID DISSOLVE
4 mg | ORAL_TABLET | Freq: Three times a day (TID) | ORAL | 0 refills | Status: AC | PRN
Start: 2017-02-24 — End: ?

## 2017-02-24 MED ORDER — DICYCLOMINE 20 MG TAB
20 mg | ORAL_TABLET | Freq: Four times a day (QID) | ORAL | 0 refills | Status: AC
Start: 2017-02-24 — End: 2017-03-01

## 2017-02-24 MED ORDER — DICYCLOMINE 20 MG TAB
20 mg | ORAL | Status: AC
Start: 2017-02-24 — End: 2017-02-24
  Administered 2017-02-24: 04:00:00 via ORAL

## 2017-02-24 MED ORDER — ONDANSETRON 4 MG TAB, RAPID DISSOLVE
4 mg | ORAL | Status: AC
Start: 2017-02-24 — End: 2017-02-24
  Administered 2017-02-24: 04:00:00 via ORAL

## 2017-02-24 MED ORDER — SODIUM CHLORIDE 0.9% BOLUS IV
0.9 % | INTRAVENOUS | Status: AC
Start: 2017-02-24 — End: 2017-02-24
  Administered 2017-02-24: 04:00:00 via INTRAVENOUS

## 2017-02-24 MED FILL — ONDANSETRON 4 MG TAB, RAPID DISSOLVE: 4 mg | ORAL | Qty: 1

## 2017-02-24 MED FILL — DICYCLOMINE 20 MG TAB: 20 mg | ORAL | Qty: 1

## 2017-02-24 NOTE — ED Notes (Signed)
Discharge paper work given to patient by Mellody Life, MD patients questions and concerns answered. Patient ambulatory at time of discharge with no difficulty. Patient discharged

## 2017-03-09 IMAGING — CT CT RENAL STONE PROTOCOL
2 of 4 series · 16 of 46 positions shown, 18 images · non-contrast
Comparison: None.

CLINICAL DATA: Right flank pain for 4 days.  Burning on urination.

EXAM:
CT ABDOMEN AND PELVIS WITHOUT CONTRAST
TECHNIQUE: Multidetector CT imaging of the abdomen and pelvis was performed
following the standard protocol without IV contrast.

[Series 2: axial st · axial · 0.66mm/px · z∈[-907,-532]mm · 13 of 83 slices shown, 15 images]
[im 4/83  soft-tissue]
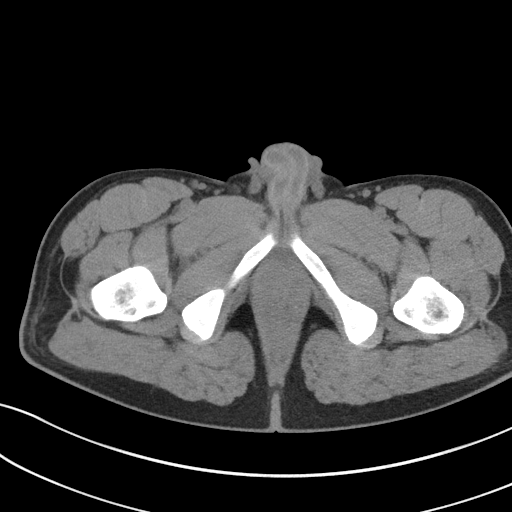
[im 4/83  bone]
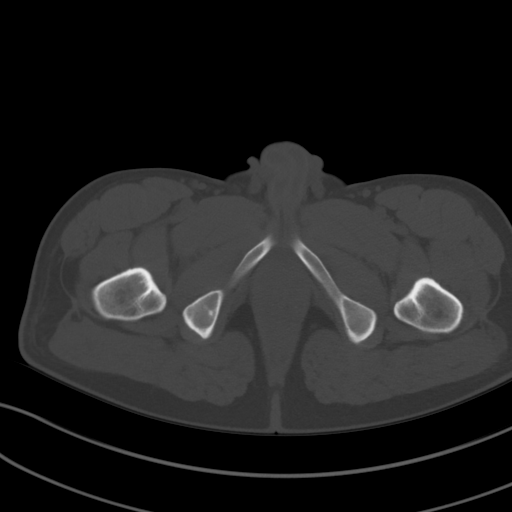
[im 10/83  soft-tissue]
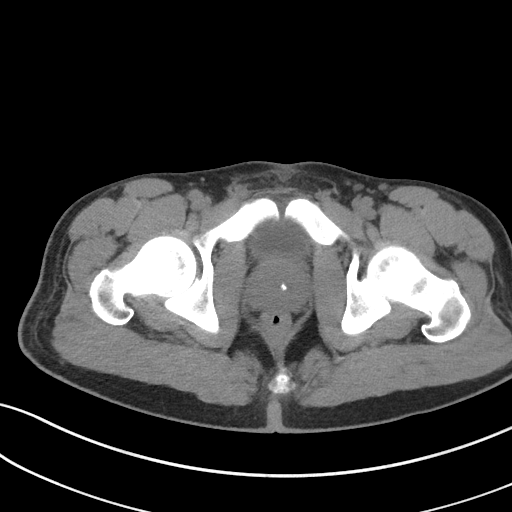
[im 17/83  soft-tissue]
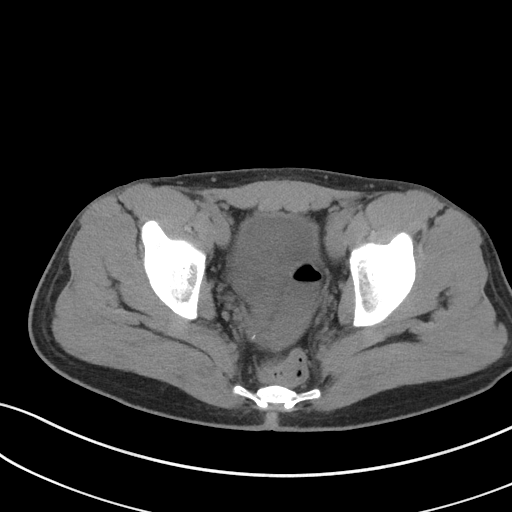
[im 23/83  soft-tissue]
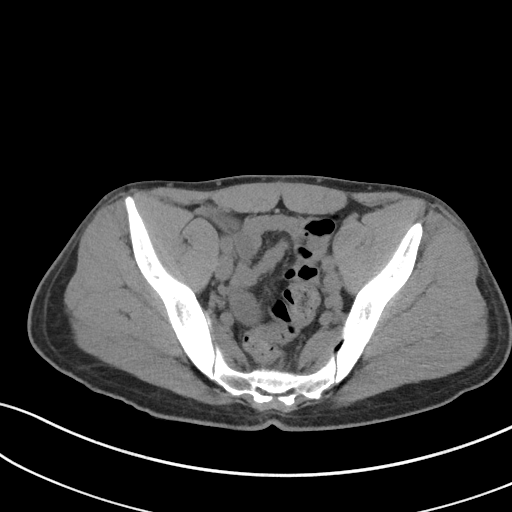
[im 30/83  soft-tissue]
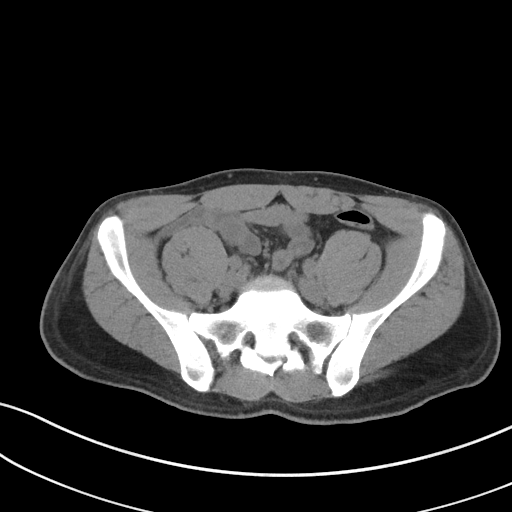
[im 37/83  soft-tissue]
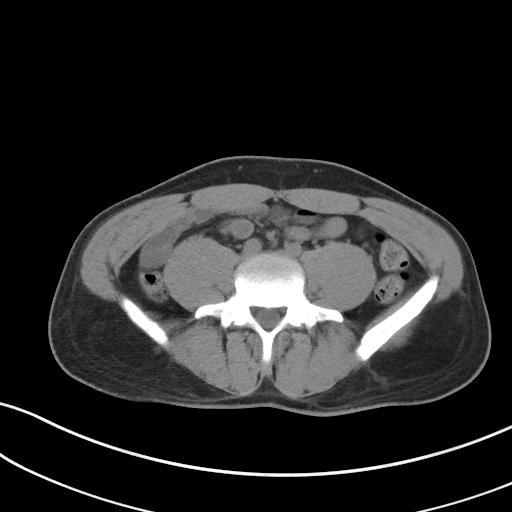
[im 43/83  soft-tissue]
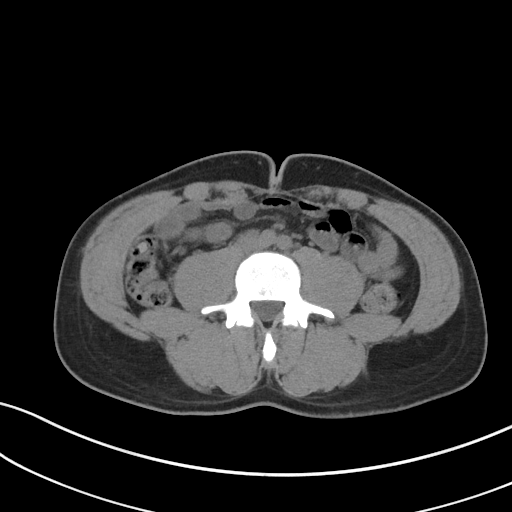
[im 46/83  soft-tissue]
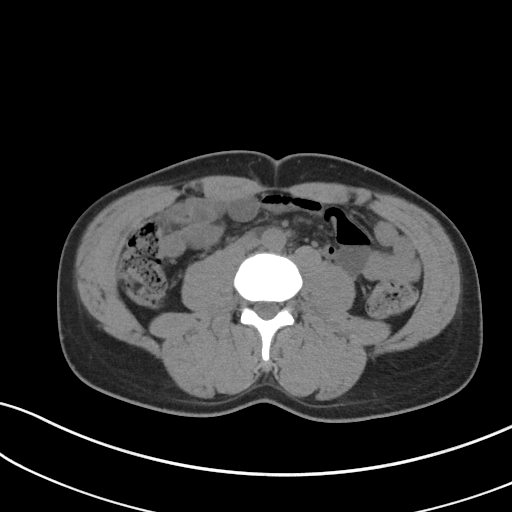
[im 53/83  soft-tissue]
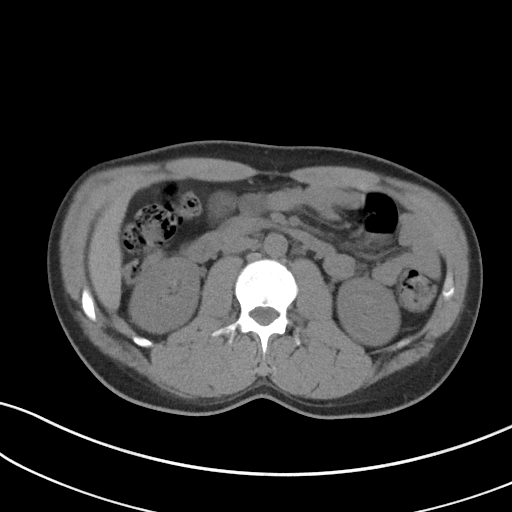
[im 53/83  bone]
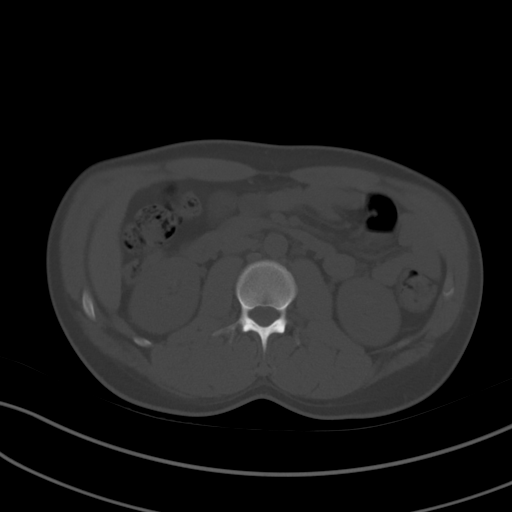
[im 60/83  soft-tissue]
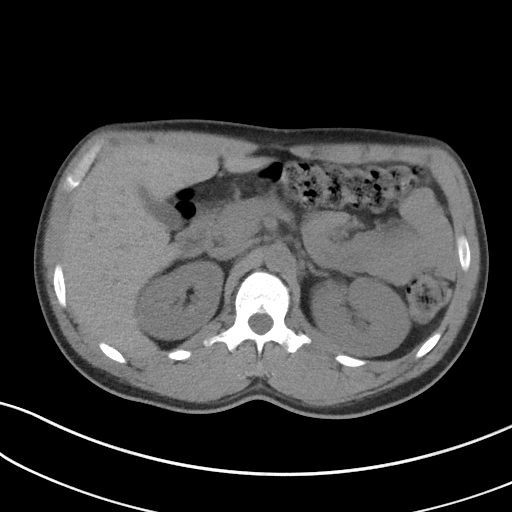
[im 66/83  soft-tissue]
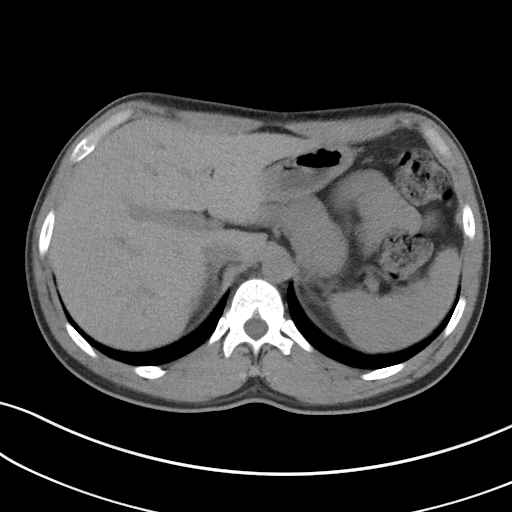
[im 73/83  soft-tissue]
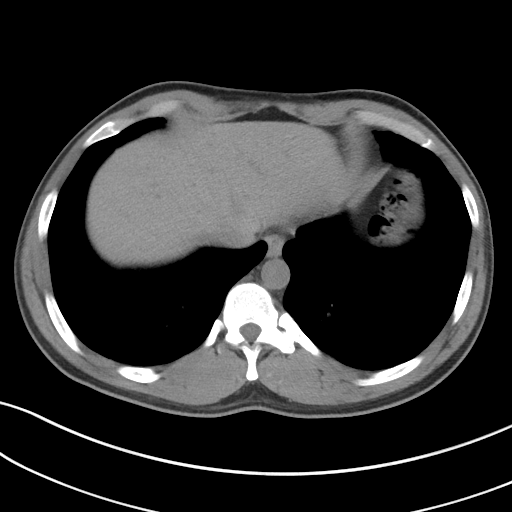
[im 79/83  soft-tissue]
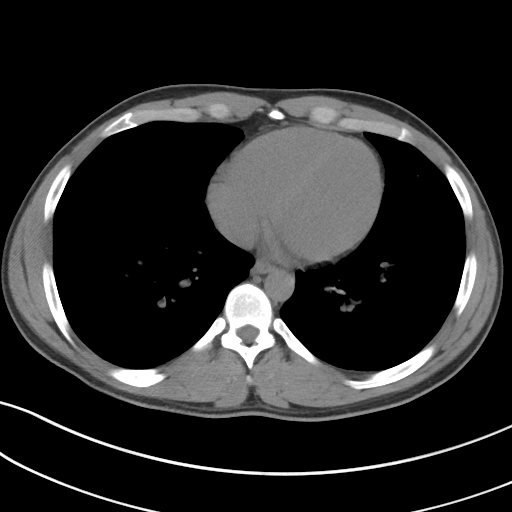

[Series 5: coronal · coronal · 0.68mm/px · 3 of 118 slices shown]
[im 40/118  soft-tissue]
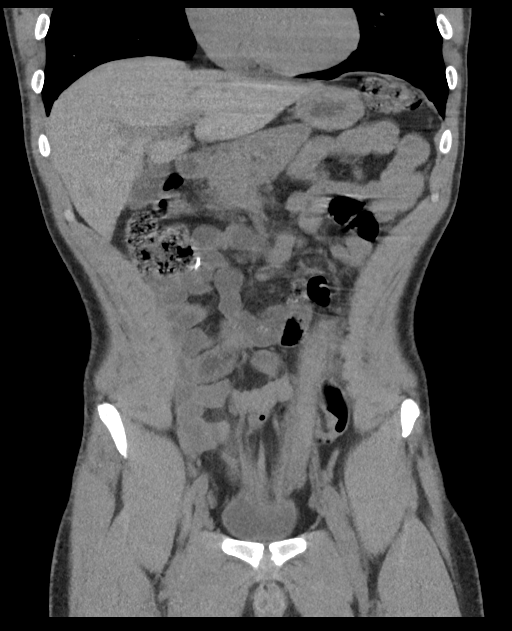
[im 53/118  soft-tissue]
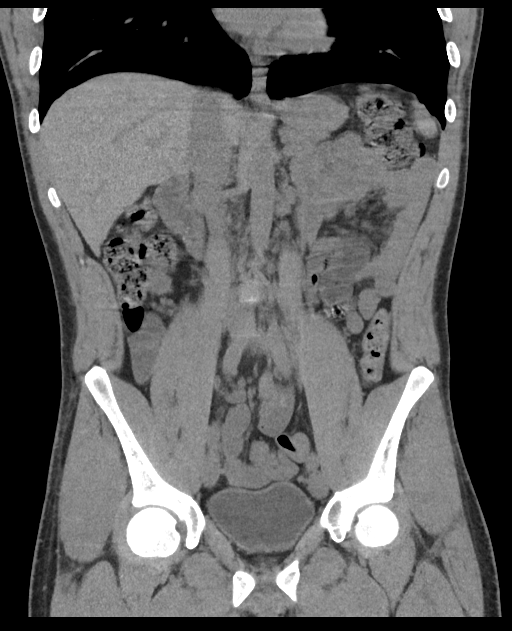
[im 66/118  soft-tissue]
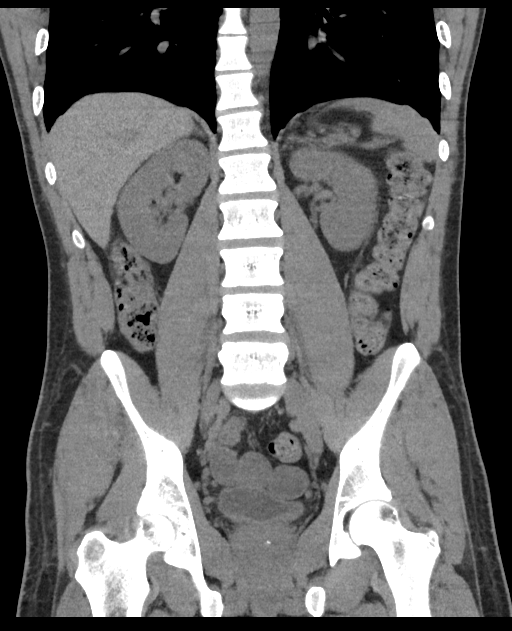

[16 of 46 positions shown; findings below may reference images not displayed]

FINDINGS: Lower chest: Minimal dependent subsegmental atelectasis. Otherwise
negative.

Hepatobiliary: No focal liver abnormality is seen. No gallstones,
gallbladder wall thickening, or biliary dilatation.

Pancreas: Unremarkable. No pancreatic ductal dilatation or
surrounding inflammatory changes.

Spleen: Normal in size without focal abnormality.

Adrenals/Urinary Tract: There may be a punctate nonobstructing stone
in the lower pole of the right kidney. No other evidence of urinary
tract stone is identified. There is no hydronephrosis on the right
or left. No stranding about the kidneys or ureters. The adrenal
glands appear normal.

Stomach/Bowel: Status post appendectomy.  Otherwise unremarkable.

Vascular/Lymphatic: Negative.

Reproductive: Unremarkable.

Other: No fluid collection or hernia.

Musculoskeletal: No bony abnormality.
IMPRESSION: Possible punctate nonobstructing stone lower pole right kidney. The
examination is otherwise negative. There is no hydronephrosis and no
other evidence of urinary tract stone is identified.

## 2017-12-04 ENCOUNTER — Emergency Department
Admission: EM | Admit: 2017-12-04 | Discharge: 2017-12-04 | Disposition: A | Discharge status: Home / Self Care | Payer: PRIVATE HEALTH INSURANCE | Attending: Emergency Medicine | Admitting: Emergency Medicine

## 2017-12-04 ENCOUNTER — Emergency Department: Payer: PRIVATE HEALTH INSURANCE

## 2017-12-04 DIAGNOSIS — R7989 Other specified abnormal findings of blood chemistry: Secondary | ICD-10-CM | POA: Insufficient documentation

## 2017-12-04 DIAGNOSIS — F17210 Nicotine dependence, cigarettes, uncomplicated: Secondary | ICD-10-CM | POA: Insufficient documentation

## 2017-12-04 DIAGNOSIS — R300 Dysuria: Secondary | ICD-10-CM

## 2017-12-04 DIAGNOSIS — Z87442 Personal history of urinary calculi: Secondary | ICD-10-CM | POA: Insufficient documentation

## 2017-12-04 DIAGNOSIS — Z9049 Acquired absence of other specified parts of digestive tract: Secondary | ICD-10-CM | POA: Insufficient documentation

## 2017-12-04 DIAGNOSIS — E890 Postprocedural hypothyroidism: Secondary | ICD-10-CM | POA: Insufficient documentation

## 2017-12-04 DIAGNOSIS — R109 Unspecified abdominal pain: Secondary | ICD-10-CM | POA: Insufficient documentation

## 2017-12-04 LAB — URINALYSIS, REFLEX TO MICROSCOPIC EXAM IF INDICATED
Bilirubin, UA: NEGATIVE
Blood, UA: NEGATIVE
Glucose, UA: NEGATIVE
Ketones UA: NEGATIVE
Leukocyte Esterase, UA: NEGATIVE
Nitrite, UA: NEGATIVE
Protein, UR: NEGATIVE
Specific Gravity UA: 1.01 (ref 1.001–1.035)
Urine pH: 6 (ref 5.0–8.0)
Urobilinogen, UA: NEGATIVE mg/dL

## 2017-12-04 LAB — CBC AND DIFFERENTIAL
Absolute NRBC: 0 10*3/uL
Basophils Absolute Automated: 0.04 10*3/uL (ref 0.00–0.20)
Basophils Automated: 0.6 %
Eosinophils Absolute Automated: 0.09 10*3/uL (ref 0.00–0.70)
Eosinophils Automated: 1.3 %
Hematocrit: 38.1 % — ABNORMAL LOW (ref 42.0–52.0)
Hgb: 12.7 g/dL — ABNORMAL LOW (ref 13.0–17.0)
Immature Granulocytes Absolute: 0.03 10*3/uL
Immature Granulocytes: 0.4 %
Lymphocytes Absolute Automated: 2.86 10*3/uL (ref 0.50–4.40)
Lymphocytes Automated: 41 %
MCH: 29.3 pg (ref 28.0–32.0)
MCHC: 33.3 g/dL (ref 32.0–36.0)
MCV: 88 fL (ref 80.0–100.0)
MPV: 8.4 fL — ABNORMAL LOW (ref 9.4–12.3)
Monocytes Absolute Automated: 0.61 10*3/uL (ref 0.00–1.20)
Monocytes: 8.7 %
Neutrophils Absolute: 3.35 10*3/uL (ref 1.80–8.10)
Neutrophils: 48 %
Nucleated RBC: 0 /100 WBC (ref 0.0–1.0)
Platelets: 418 10*3/uL — ABNORMAL HIGH (ref 140–400)
RBC: 4.33 10*6/uL — ABNORMAL LOW (ref 4.70–6.00)
RDW: 13 % (ref 12–15)
WBC: 6.98 10*3/uL (ref 3.50–10.80)

## 2017-12-04 LAB — COMPREHENSIVE METABOLIC PANEL
ALT: 76 U/L — ABNORMAL HIGH (ref 0–55)
AST (SGOT): 116 U/L — ABNORMAL HIGH (ref 5–34)
Albumin/Globulin Ratio: 1.5 (ref 0.9–2.2)
Albumin: 4.3 g/dL (ref 3.5–5.0)
Alkaline Phosphatase: 59 U/L (ref 38–106)
Anion Gap: 12 (ref 5.0–15.0)
BUN: 17 mg/dL (ref 9–28)
Bilirubin, Total: 1.3 mg/dL — ABNORMAL HIGH (ref 0.2–1.2)
CO2: 21 mEq/L — ABNORMAL LOW (ref 22–29)
Calcium: 9.6 mg/dL (ref 8.5–10.5)
Chloride: 104 mEq/L (ref 100–111)
Creatinine: 0.9 mg/dL (ref 0.7–1.3)
Globulin: 2.8 g/dL (ref 2.0–3.6)
Glucose: 78 mg/dL (ref 70–100)
Potassium: 4 mEq/L (ref 3.5–5.1)
Protein, Total: 7.1 g/dL (ref 6.0–8.3)
Sodium: 137 mEq/L (ref 136–145)

## 2017-12-04 LAB — GFR: EGFR: >60

## 2017-12-04 MED ORDER — MORPHINE SULFATE 4 MG/ML IJ/IV SOLN (WRAP)
4.0000 mg | Freq: Once | Status: AC
Start: 2017-12-04 — End: 2017-12-04
  Administered 2017-12-04: 15:00:00 4 mg via INTRAVENOUS
  Filled 2017-12-04: qty 1

## 2017-12-04 MED ORDER — HYDROCODONE-ACETAMINOPHEN 5-325 MG PO TABS
1.0000 | ORAL_TABLET | Freq: Once | ORAL | Status: AC
Start: 2017-12-04 — End: 2017-12-04
  Administered 2017-12-04: 16:00:00 1 via ORAL
  Filled 2017-12-04: qty 1

## 2017-12-04 MED ORDER — ONDANSETRON HCL 4 MG/2ML IJ SOLN
4.0000 mg | Freq: Once | INTRAMUSCULAR | Status: AC
Start: 2017-12-04 — End: 2017-12-04
  Administered 2017-12-04: 15:00:00 4 mg via INTRAVENOUS
  Filled 2017-12-04: qty 2

## 2017-12-04 MED ORDER — SODIUM CHLORIDE 0.9 % IV BOLUS
1000.0000 mL | Freq: Once | INTRAVENOUS | Status: AC
Start: 2017-12-04 — End: 2017-12-04
  Administered 2017-12-04: 15:00:00 1000 mL via INTRAVENOUS

## 2017-12-04 MED ORDER — ONDANSETRON 4 MG PO TBDP
4.0000 mg | ORAL_TABLET | Freq: Once | ORAL | Status: AC
Start: 2017-12-04 — End: 2017-12-04
  Administered 2017-12-04: 16:00:00 4 mg via ORAL
  Filled 2017-12-04: qty 1

## 2017-12-04 NOTE — ED Provider Notes (Signed)
EMERGENCY DEPARTMENT NOTE    Physician/Midlevel provider first contact with patient: 12/04/17 1358         HISTORY OF PRESENT ILLNESS   Historian:Patient  Translator Used: No    Chief Complaint: Flank Pain       27 y.o. male with history of Crohn's disease, HTN, and kidney stones who presents ED for evaluation of dysuria, nausea, and right flank pain starting 3 days ago.  Patient denies fever, chills, chest pain, shortness of breath, abdominal pain, vomiting, diarrhea or constipation.  Patient denies any penile discharge, testicular swelling or pain.  Patient denies any concern for STI.  Patient does report that he is actually active and does not use protection. Denies previous h/o of STIs.     1. Location of symptoms: GU, right flank  2. Onset of symptoms: 3 days ago  3. What was patient doing when symptoms started (Context): see above  4. Severity: moderate  5. Timing: constant  6. Activities that worsen symptoms: Urination  7. Activities that improve symptoms: nothing tried  8. Quality: burning, stabbing  9. Radiation of symptoms: no  10. Associated signs and Symptoms: see above  11. Are symptoms worsening? yes  MEDICAL HISTORY     Past Medical History:  Past Medical History:   Diagnosis Date   . Calculus of kidney    . Crohn disease    . Hypertension        Past Surgical History:  Past Surgical History:   Procedure Laterality Date   . APPENDECTOMY     . CHOLECYSTECTOMY     . LAPAROSCOPIC, APPENDECTOMY N/A 07/05/2015    Procedure: LAPAROSCOPIC, APPENDECTOMY;  Surgeon: Rosaria Ferries, MD;  Location: [facility];  Service: General;  Laterality: N/A;   . THYROIDECTOMY         Social History:  Social History     Social History   . Marital status: Single     Spouse name: N/A   . Number of children: N/A   . Years of education: N/A     Occupational History   . Not on file.     Social History Main Topics   . Smoking status: Current Every Day Smoker     Packs/day: 0.50     Years: 10.00     Types: Cigarettes   .  Smokeless tobacco: Never Used   . Alcohol use No   . Drug use: Yes     Types: Marijuana   . Sexual activity: Not on file     Other Topics Concern   . Not on file     Social History Narrative   . No narrative on file       Family History:  History reviewed. No pertinent family history.    Outpatient Medication:  Discharge Medication List as of 12/04/2017  4:01 PM      CONTINUE these medications which have NOT CHANGED    Details   ondansetron (ZOFRAN ODT) 8 MG disintegrating tablet Take 1 tablet (8 mg total) by mouth every 12 (twelve) hours as needed for Nausea., Starting Thu 02/12/2017, Print      predniSONE (DELTASONE) 10 MG tablet Take 4 tablets (40 mg total) by mouth daily.for 1 week. Taper every week by 10 mg until finished (40, 30, 20, 10), Starting Thu 02/12/2017, Print               REVIEW OF SYSTEMS   Review of Systems   Constitutional: Negative for chills and  fever.   Respiratory: Negative for shortness of breath.    Cardiovascular: Negative for chest pain.   Gastrointestinal: Positive for nausea. Negative for abdominal pain, blood in stool, constipation, diarrhea, heartburn, melena and vomiting.   Genitourinary: Positive for dysuria and flank pain (right). Negative for frequency, hematuria and urgency.        Denies penile discharge, testicular swelling, or pain   Neurological: Negative for dizziness and headaches.   All other systems reviewed and are negative.      PHYSICAL EXAM     ED Triage Vitals [12/04/17 1426]   Enc Vitals Group      BP 138/85      Heart Rate 90      Resp Rate 16      Temp 98.7 F (37.1 C)      Temp Source Temporal Art      SpO2 96 %      Weight 56.7 kg      Height 1.6 m      Head Circumference       Peak Flow       Pain Score 8      Pain Loc       Pain Edu?       Excl. in GC?      Nursing note and vitals reviewed.  Constitutional: Well developed, well nourished. No distress noted.  Head: Atraumatic. Normocephalic.   Eyes: PERRL. EOMI. Conjunctivae are not pale.  ENT: Mucous  membranes are moist and intact. Oropharynx is clear and symmetric. Patent airway.  Neck: Supple. Full ROM.   Cardiovascular: Regular rate. Regular rhythm. No murmurs, rubs, or gallops.  Pulmonary/Chest: No evidence of respiratory distress.Clear to auscultation bilaterally. No wheezing, rales or rhonchi.   Abdominal: Soft and non-distended. There is no tenderness, rebound, guarding, or rigidity. Bowel sounds present 4 quadrants.  Back: Full ROM. Nontender. Right CVA tenderness.   Extremities: No edema. No cyanosis. No clubbing. Full range of motion in all extremities.  Skin: Skin is warm and dry. No diaphoresis. No rash.   Neurological: Alert, awake, and appropriate. Normal speech. Motor normal.  Psychiatric: Good eye contact. Normal interaction, affect, and behavior.      MEDICAL DECISION MAKING     DISCUSSION      CBC to assess for anemia and infection.  CMP to assess for electrolyte abnormalities and kidney function  Lipase assess for pancreatitis   Trop r/o ischemia  UA r/o infection and assess for hematuria  EKG r/o STEMI or arrhythmia  IVF for hydration    UA without evidence infection or hematuria. Labs reviewed. Mild elevated in LFTs. CT abd/pelvis with no acute findings. Patient reports improvement with medications. Patient declined prescriptions for nausea or pain medications. Tylenol and motrin as needed for pain or fever.  Follow up with PCP. Strict return instruction discussed and provided to the patient. GC/chlaymydia cultures pending. Declined empiric treatment for STI in ED. Return to ED for any new or worsening symptoms or concerns. Plan of care discussed with patient, patient agreeable.        The patient is NOT septic.  All labs and vital signs from the current visit have been reviewed and any abnormality that is present is not due to sepsis.    Vital Signs: Reviewed the patient?s vital signs.   Nursing Notes: Reviewed and utilized available nursing notes.  Medical Records  Reviewed: Reviewed available past medical records.  Counseling: The emergency provider has spoken with the patient and discussed  today?s findings, in addition to providing specific details for the plan of care.  Questions are answered and there is agreement with the plan.        RADIOLOGY IMAGING STUDIES      CT Abd/ Pelvis without Contrast   Final Result      No acute abdominopelvic process within limitations of technique.               Recommendation:   None.      Adaline Sill, MD    12/04/2017 3:10 PM            PULSE OXIMETRY    Oxygen Saturation by Pulse Oximetry: 98%  Interventions: none  Interpretation:  normal    EMERGENCY DEPT. MEDICATIONS      ED Medication Orders     Start Ordered     Status Ordering Provider    12/04/17 1559 12/04/17 1558  HYDROcodone-acetaminophen (NORCO) 5-325 MG per tablet 1 tablet  Once     Route: Oral  Ordered Dose: 1 tablet     Last MAR action:  Given Fadumo Heng N    12/04/17 1559 12/04/17 1558  ondansetron (ZOFRAN-ODT) disintegrating tablet 4 mg  Once     Route: Oral  Ordered Dose: 4 mg     Last MAR action:  Given Iver Miklas N    12/04/17 1438 12/04/17 1437  ondansetron (ZOFRAN) injection 4 mg  Once     Route: Intravenous  Ordered Dose: 4 mg     Last MAR action:  Given Mikah Rottinghaus N    12/04/17 1438 12/04/17 1437  sodium chloride 0.9 % bolus 1,000 mL  Once     Route: Intravenous  Ordered Dose: 1,000 mL     Last MAR action:  Stopped Torence Palmeri N    12/04/17 1437 12/04/17 1437  morphine injection 4 mg  Once     Route: Intravenous  Ordered Dose: 4 mg     Last MAR action:  Given Ouida Abeyta N          LABORATORY RESULTS    Ordered and independently interpreted AVAILABLE laboratory tests. Please see results section in chart for full details.  Results for orders placed or performed during the hospital encounter of 12/04/17   UA, Reflex to Microscopic   Result Value Ref Range    Urine Type Clean Catch     Color, UA Yellow Clear - Yellow    Clarity, UA Clear  Clear - Hazy    Specific Gravity UA 1.010 1.001 - 1.035    Urine pH 6.0 5.0 - 8.0    Leukocyte Esterase, UA Negative Negative    Nitrite, UA Negative Negative    Protein, UR Negative Negative    Glucose, UA Negative Negative    Ketones UA Negative Negative    Urobilinogen, UA Negative 0.2 - 2.0 mg/dL    Bilirubin, UA Negative Negative    Blood, UA Negative Negative   Comprehensive Metabolic Panel (CMP)   Result Value Ref Range    Glucose 78 70 - 100 mg/dL    BUN 17 9 - 28 mg/dL    Creatinine 0.9 0.7 - 1.3 mg/dL    Sodium 161 096 - 045 mEq/L    Potassium 4.0 3.5 - 5.1 mEq/L    Chloride 104 100 - 111 mEq/L    CO2 21 (L) 22 - 29 mEq/L    Calcium 9.6 8.5 - 10.5 mg/dL    Protein, Total 7.1 6.0 - 8.3  g/dL    Albumin 4.3 3.5 - 5.0 g/dL    AST (SGOT) 161 (H) 5 - 34 U/L    ALT 76 (H) 0 - 55 U/L    Alkaline Phosphatase 59 38 - 106 U/L    Bilirubin, Total 1.3 (H) 0.2 - 1.2 mg/dL    Globulin 2.8 2.0 - 3.6 g/dL    Albumin/Globulin Ratio 1.5 0.9 - 2.2    Anion Gap 12.0 5.0 - 15.0   CBC with differential   Result Value Ref Range    WBC 6.98 3.50 - 10.80 x10 3/uL    Hgb 12.7 (L) 13.0 - 17.0 g/dL    Hematocrit 09.6 (L) 42.0 - 52.0 %    Platelets 418 (H) 140 - 400 x10 3/uL    RBC 4.33 (L) 4.70 - 6.00 x10 6/uL    MCV 88.0 80.0 - 100.0 fL    MCH 29.3 28.0 - 32.0 pg    MCHC 33.3 32.0 - 36.0 g/dL    RDW 13 12 - 15 %    MPV 8.4 (L) 9.4 - 12.3 fL    Neutrophils 48.0 None %    Lymphocytes Automated 41.0 None %    Monocytes 8.7 None %    Eosinophils Automated 1.3 None %    Basophils Automated 0.6 None %    Immature Granulocyte 0.4 None %    Nucleated RBC 0.0 0.0 - 1.0 /100 WBC    Neutrophils Absolute 3.35 1.80 - 8.10 x10 3/uL    Abs Lymph Automated 2.86 0.50 - 4.40 x10 3/uL    Abs Mono Automated 0.61 0.00 - 1.20 x10 3/uL    Abs Eos Automated 0.09 0.00 - 0.70 x10 3/uL    Absolute Baso Automated 0.04 0.00 - 0.20 x10 3/uL    Absolute Immature Granulocyte 0.03 0 x10 3/uL    Absolute NRBC 0.00 0 x10 3/uL   GFR   Result Value Ref Range    EGFR  >60.0        CRITICAL CARE/PROCEDURES    Procedures    DIAGNOSIS      Diagnosis:  Final diagnoses:   Dysuria   Right flank pain   Elevated LFTs       Disposition:  ED Disposition     ED Disposition Condition Date/Time Comment    Discharge  Fri Dec 04, 2017  4:01 PM Kennon Holter discharge to home/self care.    Condition at disposition: Stable          Prescriptions:  Discharge Medication List as of 12/04/2017  4:01 PM      CONTINUE these medications which have NOT CHANGED    Details   ondansetron (ZOFRAN ODT) 8 MG disintegrating tablet Take 1 tablet (8 mg total) by mouth every 12 (twelve) hours as needed for Nausea., Starting Thu 02/12/2017, Print      predniSONE (DELTASONE) 10 MG tablet Take 4 tablets (40 mg total) by mouth daily.for 1 week. Taper every week by 10 mg until finished (40, 30, 20, 10), Starting Thu 02/12/2017, Print              Daune Perch, NP  12/06/17 [phone]

## 2017-12-04 NOTE — EDIE (Signed)
COLLECTIVE?NOTIFICATION?12/04/2017 13:54?ARN, MCOMBER [facility]: [id number]    Upcoming Changes to the [facility] Notification  On December 10, 2017 the layout of this notification will be updated. For an overview of upcoming changes, please log into [email or web address]. For questions, please email [email or web address] or call [phone].?     This patient has registered at the [facility] Emergency Department   For more information visit: [email or web address]   [facility]'s patient encounter information:   NWG:?[id number]  Account [id number]  Billing Account [id number]      Criteria Met      5 ED Visits in 12 Months    Security Events  No recent Security Events currently on file    ED Care Guidelines  There are currently no ED Care Guidelines for this patient. Please check your facility's medical records system.    Care Providers  There are no care providers on record at this time.   E.D. Visit Count (12 mo.)  Facility Visits   [facility] 1   [facility] 2   [facility] 1   [place] [facility] 1   [facility] 1   [facility] 1   [facility] 1   [facility] 1   [place] [facility] 1   [facility] [place] 1   [facility] 1   Total 12   Note: Visits indicate total known visits.      Recent Emergency Department Visit Summary  Date Facility [facility] Type Major Type Diagnoses or Chief Complaint   Dec 04, 2017 [place] [facility] [place] [facility]  Emergency      Triage- UTI      Sep 03, 2017 [facility]  Emergency      Unspecified abdominal pain      Hematuria, unspecified      Nicotine dependence, unspecified, uncomplicated           Recent Inpatient Visit Summary  No recorded inpatient visits.       Prescription Monitoring Program  060??- Narcotic Use Score  030??- Sedative Use Score  000??- Stimulant Use Score  - All Scores range from 000-999 with 75% of the population scoring < 200 and on 1% scoring above 650  - The last digit of the narcotic, sedative, and stimulant score indicates the number of active prescriptions of that type  - Higher Use scores correlate with increased prescribers, pharmacies, mg equiv, and overlapping prescriptions   Concerning or unexpectedly high scores should prompt a review of the PMP record; this does not constitute checking PMP for prescribing purposes.    The above information is provided for the sole purpose of patient treatment. Use of this information beyond the terms of Data Sharing Memorandum of Understanding and License Agreement is prohibited. In certain cases not all visits may be represented. Consult the aforementioned facilities for additional information.   ? 2019 [employer] - [place], [place] - [email or web address]

## 2017-12-04 NOTE — Discharge Instructions (Signed)
Medications as prescribed.   Tylenol and motrin as needed for pain or fever.    Get plenty fluids and rest  Follow up with PCP.   Return to ED for any new or worsening symptoms or concerns including fever, chills, persistent worsening back or abdominal pain, nausea, vomiting, or any other concern.         Dysuria    You have been seen for dysuria.     Dysuria is the medical term for painful urination. There are several causes and they include:   Bladder infection.   Sexually transmitted diseases (STDs). These include gonorrhea, chlamydia, syphilis, herpes and others).   Prostate problem.   Kidney stones.   Bladder, prostate or kidney cancer (these are rare).    Although the cause of your pain and discomfort is not yet known, we believe it is OK for you to go home.    The doctor will decide whether to prescribe pain medicine.    Your urine will be sent for further analysis. This may help to find the cause of your pain.    Follow up with your family doctor to try to find a cause for your pain.    YOU SHOULD SEEK MEDICAL ATTENTION IMMEDIATELY, EITHER HERE OR AT THE NEARST EMERGENCY DEPARTMENT IF ANY OF THE FOLLOWING OCCURS:   You have pain in your belly, pelvis or back.   You have fever (temperature higher than 100.36F / 38C) that won't go away.   You have vomiting that does not stop after a few times.   You are unable to urinate.   You have any other concerns.          Abnormal Liver Function Tests    You had abnormal liver tests.    Your liver is an important organ in your body. The liver metabolizes (breaks down) drugs and reduces toxins in your blood stream. The liver also makes clotting factors to stop bleeding and helps with the storage of fat and vitamins. Several blood tests can be done to see how well your liver is working. Common tests include AST (aspartate aminotransferase) and ALT (alanine aminotransferase). These are enzymes (important proteins in the blood) that are found in your  liver. If there is injury, these proteins and others leak into your blood stream and can be measured.    Your doctor found a small abnormality in your liver tests. Some reasons for this are:   Acetaminophen (Tylenol) overdose or too much use.   Abusing alcohol or using it too much.   Chronic hepatitis.   Fatty liver because of diabetes or obesity.   Some medications like anti-seizure medications, antibiotics or cholesterol medications.    Your doctor does not feel that anything needs to be done about your elevated liver functions right away. You might need another exam or more tests to find out why you have this abnormal lab result. At this time, the cause of your laboratory results does not seem dangerous. You do not need to stay in the hospital.    In some cases, an abnormal liver function result can be a sign of something serious or an illness. This is why it's important for your primary doctor to keep a close eye on you. The doctor will need to recheck your abnormal laboratory result.    You will need to follow up with your doctor to have your liver tests rechecked. You will need to have more tests done to figure out why your liver  tests are abnormal. You should also let your primary care doctor know about your abnormal test results right away.    If medication is prescribed for you, please take it as instructed.     YOU SHOULD SEEK MEDICAL ATTENTION IMMEDIATELY, EITHER HERE OR AT THE NEAREST EMERGENCY DEPARTMENT, IF ANY OF THE FOLLOWING OCCUR:     You can't get your liver function tests rechecked in the time period your doctor recommended today.   You notice your skin or eyes turning yellow (jaundice).   You have easy bruising or bleeding.   You have severe abdominal (belly) pain.   You have chest pain or shortness of breath.    If you can't follow up with your doctor, or if at any time you feel you need to be rechecked or seen again, come back here or go to the nearest emergency  department.

## 2017-12-04 NOTE — ED Triage Notes (Signed)
Pt to triage with a c/o right flank pain, nausea and frequency/urgency x 2 days.

## 2017-12-07 LAB — URINE CHLAMYDIA/NEISSERIA BY PCR
Chlamydia DNA by PCR: NEGATIVE
Neisseria gonorrhoeae by PCR: NEGATIVE

## 2018-04-21 ENCOUNTER — Emergency Department: Payer: Medicaid HMO

## 2018-04-21 ENCOUNTER — Emergency Department
Admission: EM | Admit: 2018-04-21 | Discharge: 2018-04-21 | Disposition: A | Discharge status: Home / Self Care | Payer: Medicaid HMO | Attending: Emergency Medicine | Admitting: Emergency Medicine

## 2018-04-21 DIAGNOSIS — R110 Nausea: Secondary | ICD-10-CM | POA: Insufficient documentation

## 2018-04-21 DIAGNOSIS — R109 Unspecified abdominal pain: Secondary | ICD-10-CM | POA: Insufficient documentation

## 2018-04-21 DIAGNOSIS — R300 Dysuria: Secondary | ICD-10-CM | POA: Insufficient documentation

## 2018-04-21 LAB — CBC AND DIFFERENTIAL
Basophils %: 0.6 % (ref 0.0–3.0)
Basophils Absolute: 0 10*3/uL (ref 0.0–0.3)
Eosinophils %: 0.7 % (ref 0.0–7.0)
Eosinophils Absolute: 0.1 10*3/uL (ref 0.0–0.8)
Hematocrit: 41.7 % (ref 39.0–52.5)
Hemoglobin: 14.1 gm/dL (ref 13.0–17.5)
Lymphocytes Absolute: 2.2 10*3/uL (ref 0.6–5.1)
Lymphocytes: 31.8 % (ref 15.0–46.0)
MCH: 31 pg (ref 28–35)
MCHC: 34 gm/dL (ref 32–36)
MCV: 92 fL (ref 80–100)
MPV: 6 fL (ref 6.0–10.0)
Monocytes Absolute: 0.5 10*3/uL (ref 0.1–1.7)
Monocytes: 7 % (ref 3.0–15.0)
Neutrophils %: 59.9 % (ref 42.0–78.0)
Neutrophils Absolute: 4.1 10*3/uL (ref 1.7–8.6)
PLT CT: 364 10*3/uL (ref 130–440)
RBC: 4.53 10*6/uL (ref 4.00–5.70)
RDW: 11.9 % (ref 11.0–14.0)
WBC: 6.9 10*3/uL (ref 4.0–11.0)

## 2018-04-21 LAB — BASIC METABOLIC PANEL
Anion Gap: 12 mMol/L (ref 7.0–18.0)
BUN / Creatinine Ratio: 18.3 Ratio (ref 10.0–30.0)
BUN: 15 mg/dL (ref 7–22)
CO2: 25 mMol/L (ref 20.0–30.0)
Calcium: 9.6 mg/dL (ref 8.5–10.5)
Chloride: 105 mMol/L (ref 98–110)
Creatinine: 0.82 mg/dL (ref 0.80–1.30)
EGFR: 119 mL/min/{1.73_m2} (ref 60–150)
Glucose: 84 mg/dL (ref 71–99)
Osmolality Calc: 276 mOsm/kg (ref 275–300)
Potassium: 4 mMol/L (ref 3.5–5.3)
Sodium: 138 mMol/L (ref 136–147)

## 2018-04-21 LAB — VH URINALYSIS WITH MICROSCOPIC AND CULTURE IF INDICATED
Bilirubin, UA: NEGATIVE mg/dL
Blood, UA: NEGATIVE mg/dL
Glucose, UA: NEGATIVE mg/dL
Ketones UA: NEGATIVE
Leukocyte Esterase, UA: NEGATIVE Leu/uL
Nitrite, UA: NEGATIVE
Protein, UR: NEGATIVE mg/dL
Urine Specific Gravity: 1.011 (ref 1.001–1.040)
Urobilinogen, UA: 0.2 mg/dL
pH, Urine: 6 pH (ref 5.0–8.0)

## 2018-04-21 MED ORDER — ACETAMINOPHEN 10 MG/ML IV SOLN
INTRAVENOUS | Status: AC
Start: 2018-04-21 — End: ?
  Filled 2018-04-21: qty 100

## 2018-04-21 MED ORDER — ONDANSETRON HCL 4 MG/2ML IJ SOLN
INTRAMUSCULAR | Status: AC
Start: 2018-04-21 — End: ?
  Filled 2018-04-21: qty 2

## 2018-04-21 MED ORDER — ACETAMINOPHEN 10 MG/ML IV SOLN
1000.0000 mg | Freq: Once | INTRAVENOUS | Status: AC
Start: 2018-04-21 — End: 2018-04-21
  Administered 2018-04-21: 10:00:00 1000 mg via INTRAVENOUS

## 2018-04-21 MED ORDER — ONDANSETRON HCL 4 MG/2ML IJ SOLN
4.0000 mg | Freq: Once | INTRAMUSCULAR | Status: AC
Start: 2018-04-21 — End: 2018-04-21
  Administered 2018-04-21: 10:00:00 4 mg via INTRAVENOUS

## 2018-04-21 NOTE — EDIE (Signed)
COLLECTIVE?NOTIFICATION?04/21/2018 08:40?BURNELL, HURTA [facility]: [id number]    Criteria Met      High Utilization (6+ ED Visits/6 Mo.)    Security and Safety  No recent Security Events currently on file    ED Care Guidelines  There are currently no ED Care Guidelines for this patient. Please check your facility's medical records system.      Prescription Monitoring Program  040??- Narcotic Use Score  020??- Sedative Use Score  000??- Stimulant Use Score  190??- Overdose Risk Score  - All Scores range from 000-999 with 75% of the population scoring < 200 and on 1% scoring above 650  - The last digit of the narcotic, sedative, and stimulant score indicates the number of active prescriptions of that type  - Higher Use scores correlate with increased prescribers, pharmacies, mg equiv, and overlapping prescriptions  - Higher Overdose Risk Scores correlate with increased risk of unintentional overdose death   Concerning or unexpectedly high scores should prompt a review of the PMP record; this does not constitute checking PMP for prescribing purposes.      E.D. Visit Count (12 mo.)  Facility Visits   [facility] 1   [facility] 2   [facility] 1   [facility] 1   [place] [facility] 1   [facility] 1   [facility] 2   [facility] [employer] 1   [facility] 1   [place] 1   [facility] [place] [facility] 1   Total 13   Note: Visits indicate total known visits.      Recent Emergency Department Visit Summary  Showing 10 most recent visits out of 13 in the past 12 months  Date Facility [facility] Type Diagnoses or Chief Complaint   Apr 21, 2018 [facility] [place] Emergency      Flank Pain      Dec 19, 2017 [facility] [place] Emergency      1. Major depressive disorder, single episode, unspecified      2.  Homelessness      3. Restlessness and agitation      4. Nicotine dependence, unspecified, uncomplicated      Dec 17, 2017 [facility] [place] [place] Emergency      Urinary Pain      Flank Pain      Dysuria      Unspecified abdominal pain      Dec 11, 2017 [facility] [place] Emergency      Calculus of kidney      Painful micturition, unspecified      Allergy status to other drugs, medicaments and biological substances status      Tobacco use      Crohn's disease, unspecified, without complications      Dec 10, 2017 [facility] [place] [place] [facility]     Dec 08, 2017 [facility] [place] [facility]      kidney stone/ uti      FLANK PAIN      1. Unspecified abdominal pain      3. Dysuria      5. Nausea      7. Crohn's disease, unspecified, without complications      8. Nicotine dependence, cigarettes, uncomplicated      9. Personal history of urinary  calculi      10. Allergy status to analgesic agent status      11. Radiographic dye allergy status      Dec 05, 2017 [facility] [place] [facility] [place] [facility]      RASH      Unspecified contact dermatitis, unspecified cause      Dec 04, 2017 [place] [facility] [place] Emergency      Triage- UTI      Flank Pain      Unspecified abdominal pain      Dysuria      Abnormal results of liver function studies      Sep 03, 2017 [facility] [place] Emergency      Unspecified abdominal pain      Hematuria, unspecified      Nicotine dependence, unspecified, uncomplicated      Sep 02, 2017 [facility] [place] [place] Emergency      Flank Injury      Dysuria      Unspecified abdominal pain      Hematuria, unspecified          Recent Inpatient Visit Summary  No recorded inpatient visits.     Care Providers  There are no care providers on record at this time.   Collective Portal  This patient has registered at the [facility] Emergency Department   For more information visit:  [email or web address]     PLEASE NOTE:    1.   Any care recommendations and other clinical information are provided as guidelines or for historical purposes only, and providers should exercise their own clinical judgment when providing care.    2.   You may only use this information for purposes of treatment, payment or health care operations activities, and subject to the limitations of applicable Collective Policies.    3.   You should consult directly with the organization that provided a care guideline or other clinical history with any questions about additional information or accuracy or completeness of information provided.    ? 2019 [employer] - [email or web address]

## 2018-04-21 NOTE — ED Provider Notes (Signed)
Physician/Midlevel provider first contact with patient: 04/21/18 0905         History     Chief Complaint   Patient presents with   . Flank Pain   . Urinary Tract Infection Symptoms     HPI this 27 year old patient is complaining of 2 days of right flank pain, with dysuria.  He has not had fever, has had some nausea, no vomiting.  He has a history of kidney stones.  He also has a history of Crohn's disease which she says is in remission.  He is not having abdominal pain.  No stool symptoms.  Reports that the last time he had pain like this was several months ago.  Denies any recent hospital or ER visits.        Past Medical History:   Diagnosis Date   . Calculus of kidney    . Crohn disease    . Hypertension        Past Surgical History:   Procedure Laterality Date   . APPENDECTOMY     . CHOLECYSTECTOMY     . LAPAROSCOPIC, APPENDECTOMY N/A 07/05/2015    Procedure: LAPAROSCOPIC, APPENDECTOMY;  Surgeon: Ricky Ferries, MD;  Location: [facility];  Service: General;  Laterality: N/A;   . THYROIDECTOMY         History reviewed. No pertinent family history.    Social  Social History   Substance Use Topics   . Smoking status: Current Every Day Smoker     Packs/day: 0.50     Years: 10.00     Types: Cigarettes   . Smokeless tobacco: Never Used   . Alcohol use No       .     Allergies   Allergen Reactions   . Nsaids        Home Medications     Med List Status:  In Progress Set By: Ricky Primes, RN at 04/21/2018  9:49 AM        No Medications           Review of Systems   Constitutional: Negative.    HENT: Negative.    Respiratory: Negative.    Cardiovascular: Negative.    Gastrointestinal: Positive for abdominal pain and nausea. Negative for vomiting.   Genitourinary: Positive for dysuria, flank pain and frequency.   Skin: Negative.    Neurological: Negative.    Psychiatric/Behavioral: Negative.    All other systems reviewed and are negative.      Physical Exam    BP: 123/85, Heart Rate: 81, Temp: 98.1 F (36.7  C), Resp Rate: 12, SpO2: 100 %, Weight: 56.7 kg    Physical Exam   Constitutional: He is oriented to person, place, and time. He appears well-developed and well-nourished. No distress.   Appears comfortable.   HENT:   Head: Normocephalic.   Mouth/Throat: Oropharynx is clear and moist.   Eyes: Pupils are equal, round, and reactive to light.   Neck: Normal range of motion.   Cardiovascular: Normal rate and regular rhythm.    Pulmonary/Chest: Effort normal and breath sounds normal.   Abdominal: Soft. Bowel sounds are normal.   Abdomen soft and nontender and benign.  He has inconsistent tenderness to palpation of his right flank.   Neurological: He is alert and oriented to person, place, and time.   Skin: No pallor.   Psychiatric: He has a normal mood and affect. His behavior is normal.   Nursing note and vitals reviewed.  MDM and ED Course   922 -I noted in his chart that he was just seen on June 8 in a [facility] ER.  Note is available in epic, and he was seen for flank pain and dysuria on June 8.  Had a CAT scan and ultrasound and blood work as well as a UA, and decision was that this was musculoskeletal pain.  Despite this entry in the record, patient adamantly denies going to this ER, or seeing any other ER in the last several months.  He denies this even after I pointed out to him that if he chose to tell us that he was there on June 8, there would be no repercussions other than that we would not repeat a workup he had already had.  Strongly denies having a CAT scan in the last year.  He still wants to be worked up for this issue, I did tell him that with what I see in the chart, I would not be providing any controlled substances, and he still wished to continue.  I warned him of the danger of cancer with repeat scans.  He says there is no worry of that here, because was not the patient that was at the hospital in [place].  His exam is inconsistent.  Giving him IV Ofirmev, and Zofran, and proceeding  with work-up as he desires.    1005 -CT and labs are all within normal limits, urine within normal limits.  He is resting comfortably.  The result here looks remarkably like CT results from CT that was done June 8 that he said was not performed on him.  There is no indication for antibiotic here, I have instructed him that he needs to follow-up with a primary care doctor of his choice, and return to the emergency room with any worsening of condition.  He denies need for work note.  Peers very well as we prepare for discharge.  ED Medication Orders     Start Ordered     Status Ordering Provider    04/21/18 0919 04/21/18 0918  ondansetron (ZOFRAN) injection 4 mg  Once in ED     Route: Intravenous  Ordered Dose: 4 mg     Last MAR action:  Given Ricky Long    04/21/18 0919 04/21/18 0918  acetaminophen (TYLENOL/OFIRMEV) injection 1,000 mg  Once     Route: Intravenous  Ordered Dose: 1,000 mg     Last MAR action:  New Bag Ricky Long             MDM                 Procedures    Clinical Impression & Disposition     Clinical Impression  Final diagnoses:   Flank pain, acute        ED Disposition     ED Disposition Condition Date/Time Comment    Discharge  Wed Apr 21, 2018 10:05 AM Ricky Long. discharge to home/self care.    Condition at disposition: Stable           New Prescriptions    No medications on file                 Ricky Ducking, MD  04/21/18 1006

## 2018-04-21 NOTE — ED Triage Notes (Signed)
Patient reports 2 days of right flank pain with burning and frequency with urination. Patient reports he has a HX of kidney stones. Patient denies fevers or blood in urine but does report nausea but no vomiting. Patient denies any other abdominal pain, diarrhea, chills. Patient has a HX of chrons

## 2018-04-21 NOTE — Discharge Instructions (Signed)
Flank Pain, Uncertain Cause  The flank is the area between your upper abdomen and your back. Pain there is often caused by a problem with your kidneys. It might be a kidney infection or a kidney stone. Other causes of flank pain include spinal arthritis, a pinched nerve from a back injury, or a back muscle strain or spasm.  The cause of your flank pain is not certain. You may need other tests.  Home care  Follow these tips when caring for yourself at home:   You may use acetaminophen or ibuprofen to control pain, unless your health care provider prescribed another medicine. If you have chronic liver or kidney disease, talk with your provider before taking these medicines. Also talk with your provider first if you've ever had a stomach ulcer or GI bleeding.   If the pain is coming from your muscles, you may get relief with ice or heat. During the first 2 days after the injury, put an ice pack on the painful area for 20 minutes every 2 to 4 hours. This will reduce swelling and pain. A hot shower, hot bath, or heating pad works well for a muscle spasm. You can start with ice, then switch to heat after 2 days. You might find that alternating ice and heat works well. Use the method that feels the best to you.  Follow-up care  Follow up with your healthcare provider if your symptoms don't get better over the next few days.  When to seek medical advice  Call your healthcare provider right awayif any of these happen:   Repeated vomiting   Fever of 100.77F (38C) or higher, or as directed by your health care provider   Flank pain that gets worse   Pain that spreads to the front of your belly (abdomen)   Dizziness, weakness, or fainting   Blood in your urine   Burning feeling when you urinate or the need to urinate often   Pain in one of your legs that gets worse   Numbness or weakness in a leg  Date Last Reviewed: 08/11/2015   2000-2018 [employer], [place]. [address], [place], [place] [address]. All  rights reserved. This information is not intended as a substitute for professional medical care. Always follow your healthcare professional's instructions.      Thank you for choosing [facility] for your emergency care needs. We strive to provide EXCELLENT care to you and your family.  YOUR ACCURATE CONTACT INFORMATION IS VERY IMPORTANT  Before leaving please check with registration to make sure we have an up-to-date contact number. A Toll-free post discharge Customer Service number is available to update your registration/insurance information as well as answer any billing questions or concerns. That number is [phone]   Discharge Message  YOU ARE THE MOST IMPORTANT FACTOR IN YOUR RECOVERY. Follow the above instructions carefully. Take your medicines as prescribed. Most important, see your  doctor in follow-up  as recommended by your ED physician    IF YOU DO NOT CONTINUE TO IMPROVE OR YOU HAVE ANY NEW, WORSENING O SEVERE SYMPTOMS, PLEASE CONTACT YOUR DOCTOR   IF YOUR REQUIRE IMMEDIATE ASSISTANCE, RETURN TO THE EMERGENCEY DEPARTMENT OR CALL 911.  MEDICAL RECORDS AND TESTS  Certain laboratory test results do not come back the same day, for example: urine cultures may take 3 days. We will attempt to contact you if other important findings are noted. Some lab testing may take 2-5 days. Radiology films are reviewed again to  ensure accuracy. If there is any discrepancy, we will notify you.     EXTRA AVAILABLE RESOURCES:  1. DOCTOR REFERRALS  a. Call  our Physician Referral Line at [phone]   b. [email or web address].  For physician referrals and other services that [facility] offers.    2. FREE HEALTH SERVICES  a. www.freemedicalsearch.org  b. http://[email or web address]  May be utilized if you need help with health or social services, please call 2-1-1 for a free referral to resources in your area. 2-1-1 is a free service connecting people with information on health insurance, free  clinics, pregnancy, mental health, dental care, food assistance, housing, and substance abuse counseling.  Pharmacy information  Prescriptions can be filled at the pharmacy of your choice.  The Emergency Department does not authorize prescription refills.  Please contact your primary care physician or clinic for this.    [facility] has been providing home care solutions for independent living since 1984. Servicing [place]. [facility] is a full service home medical provider of home oxygen and respiratory care, medical equipment and supplies.  [phone]    Thanks Again, for allowing   [facility]   to serve you.  [phone]

## 2018-04-21 NOTE — ED Notes (Signed)
Dr. Freilich at bedside.

## 2018-12-29 ENCOUNTER — Emergency Department (HOSPITAL_BASED_OUTPATIENT_CLINIC_OR_DEPARTMENT_OTHER): Payer: Self-pay

## 2018-12-29 ENCOUNTER — Inpatient Hospital Stay
Admission: EM | Admit: 2018-12-29 | Discharge: 2019-01-04 | DRG: 755 | Disposition: A | Discharge status: Home / Self Care | Payer: Medicaid Other | Attending: Psychiatry | Admitting: Psychiatry

## 2018-12-29 DIAGNOSIS — F489 Nonpsychotic mental disorder, unspecified: Secondary | ICD-10-CM

## 2018-12-29 DIAGNOSIS — F1520 Other stimulant dependence, uncomplicated: Secondary | ICD-10-CM | POA: Diagnosis present

## 2018-12-29 DIAGNOSIS — J309 Allergic rhinitis, unspecified: Secondary | ICD-10-CM | POA: Diagnosis present

## 2018-12-29 DIAGNOSIS — R45851 Suicidal ideations: Secondary | ICD-10-CM | POA: Diagnosis present

## 2018-12-29 DIAGNOSIS — F32A Depression, unspecified: Secondary | ICD-10-CM

## 2018-12-29 DIAGNOSIS — F1220 Cannabis dependence, uncomplicated: Secondary | ICD-10-CM | POA: Diagnosis present

## 2018-12-29 DIAGNOSIS — Z609 Problem related to social environment, unspecified: Secondary | ICD-10-CM | POA: Diagnosis present

## 2018-12-29 DIAGNOSIS — Z560 Unemployment, unspecified: Secondary | ICD-10-CM

## 2018-12-29 DIAGNOSIS — I10 Essential (primary) hypertension: Secondary | ICD-10-CM | POA: Diagnosis present

## 2018-12-29 DIAGNOSIS — Z590 Homelessness: Secondary | ICD-10-CM

## 2018-12-29 DIAGNOSIS — F4323 Adjustment disorder with mixed anxiety and depressed mood: Principal | ICD-10-CM | POA: Diagnosis present

## 2018-12-29 DIAGNOSIS — F1524 Other stimulant dependence with stimulant-induced mood disorder: Secondary | ICD-10-CM | POA: Diagnosis present

## 2018-12-29 DIAGNOSIS — F17200 Nicotine dependence, unspecified, uncomplicated: Secondary | ICD-10-CM | POA: Diagnosis present

## 2018-12-29 DIAGNOSIS — F331 Major depressive disorder, recurrent, moderate: Secondary | ICD-10-CM

## 2018-12-29 LAB — SALICYLATE LEVEL: Salicylate Level: <5 mg/dL — ABNORMAL LOW (ref 15.0–30.0)

## 2018-12-29 LAB — CBC AND DIFFERENTIAL
Absolute NRBC: 0 10*3/uL (ref 0.00–0.00)
Basophils Absolute Automated: 0.02 10*3/uL (ref 0.00–0.08)
Basophils Automated: 0.2 %
Eosinophils Absolute Automated: 0.04 10*3/uL (ref 0.00–0.44)
Eosinophils Automated: 0.5 %
Hematocrit: 40.6 % (ref 37.6–49.6)
Hgb: 13.5 g/dL (ref 12.5–17.1)
Immature Granulocytes Absolute: 0.03 10*3/uL (ref 0.00–0.07)
Immature Granulocytes: 0.3 %
Lymphocytes Absolute Automated: 3.17 10*3/uL (ref 0.42–3.22)
Lymphocytes Automated: 36.4 %
MCH: 30.2 pg (ref 25.1–33.5)
MCHC: 33.3 g/dL (ref 31.5–35.8)
MCV: 90.8 fL (ref 78.0–96.0)
MPV: 8.4 fL — ABNORMAL LOW (ref 8.9–12.5)
Monocytes Absolute Automated: 0.43 10*3/uL (ref 0.21–0.85)
Monocytes: 4.9 %
Neutrophils Absolute: 5.03 10*3/uL (ref 1.10–6.33)
Neutrophils: 57.7 %
Nucleated RBC: 0 /100 WBC (ref 0.0–0.0)
Platelets: 353 10*3/uL — ABNORMAL HIGH (ref 142–346)
RBC: 4.47 10*6/uL (ref 4.20–5.90)
RDW: 13 % (ref 11–15)
WBC: 8.72 10*3/uL (ref 3.10–9.50)

## 2018-12-29 LAB — COMPREHENSIVE METABOLIC PANEL
ALT: 15 U/L (ref 0–55)
AST (SGOT): 28 U/L (ref 5–34)
Albumin/Globulin Ratio: 1.4 (ref 0.9–2.2)
Albumin: 4.6 g/dL (ref 3.5–5.0)
Alkaline Phosphatase: 61 U/L (ref 38–106)
BUN: 16 mg/dL (ref 9.0–28.0)
Bilirubin, Total: 0.6 mg/dL (ref 0.2–1.2)
CO2: 24 mEq/L (ref 22–29)
Calcium: 10 mg/dL (ref 8.5–10.5)
Chloride: 101 mEq/L (ref 100–111)
Creatinine: 0.9 mg/dL (ref 0.7–1.3)
Globulin: 3.2 g/dL (ref 2.0–3.6)
Glucose: 77 mg/dL (ref 70–100)
Potassium: 4.1 mEq/L (ref 3.5–5.1)
Protein, Total: 7.8 g/dL (ref 6.0–8.3)
Sodium: 138 mEq/L (ref 136–145)

## 2018-12-29 LAB — RAPID DRUG SCREEN, URINE
Barbiturate Screen, UR: NEGATIVE
Benzodiazepine Screen, UR: NEGATIVE
Cannabinoid Screen, UR: POSITIVE — AB
Cocaine, UR: NEGATIVE
Opiate Screen, UR: NEGATIVE
PCP Screen, UR: NEGATIVE
Urine Amphetamine Screen: NEGATIVE

## 2018-12-29 LAB — TSH: TSH: 0.77 u[IU]/mL (ref 0.35–4.94)

## 2018-12-29 LAB — ETHANOL: Alcohol: NOT DETECTED mg/dL

## 2018-12-29 LAB — GFR: EGFR: >60

## 2018-12-29 LAB — ACETAMINOPHEN LEVEL: Acetaminophen Level: <7 ug/mL — ABNORMAL LOW (ref 10–30)

## 2018-12-29 LAB — PHOSPHORUS: Phosphorus: 4.2 mg/dL (ref 2.3–4.7)

## 2018-12-29 LAB — MAGNESIUM: Magnesium: 1.8 mg/dL (ref 1.6–2.6)

## 2018-12-29 NOTE — ED Notes (Signed)
NURSING NOTE FOR THE RECEIVING INPATIENT NURSE     ED NURSE Porfirio Mylar [id number]   ED CHARGE NURSE [id number]   ADMISSION INFORMATION   Ricky Long. is a 28 y.o. male admitted with a diagnosis of:    1. Suicidal ideation       Isolation:  None  Place of residence / Living situation:  Home Independent   NURSING CARE   Mental Status alert and oriented   ADL ADLs:            Independent with all ADLs  Ambulation:  No difficulty   Pertinent Information  and Safety Concerns Recent meth use, relapse after years of sobriety. Patient calm and cooperative.       VITAL SIGNS     Time of Last Set of Vitals 2319   Temperature 97.9 F oral    BP 103/62   Pulse 76   Respirations 14   Pulse OX 97% room air         IV LINES           LAB RESULTS     Labs Reviewed   CBC AND DIFFERENTIAL - Abnormal; Notable for the following components:       Result Value    Platelets 353 (*)     MPV 8.4 (*)     All other components within normal limits   ACETAMINOPHEN LEVEL - Abnormal; Notable for the following components:    Acetaminophen Level <7 (*)     All other components within normal limits   SALICYLATE LEVEL - Abnormal; Notable for the following components:    Salicylate Level <5.0 (*)     All other components within normal limits   RAPID DRUG SCREEN, URINE - Abnormal; Notable for the following components:    Cannabinoid Screen, UR Positive (*)     All other components within normal limits   COMPREHENSIVE METABOLIC PANEL   MAGNESIUM   PHOSPHORUS   ETHANOL   TSH   GFR        PATIENT BELONGINGS     Security.

## 2018-12-29 NOTE — ED Notes (Signed)
Sitter at bedside.

## 2018-12-29 NOTE — ED Notes (Signed)
This patient was seen by me in triage and initial testing was ordered based on presenting complaint. Care was expedited. I am not the primary provider for this patient.     Presents for SI.  No plan.     Talise Sligh, Louanne Belton., MD  12/29/18 1914

## 2018-12-29 NOTE — ED Provider Notes (Signed)
ATTENDING NOTE    I saw and evaluated the patient. Discussed with resident/ midlevel and agree with the resident's/ midlevel's findings and plan as documented in the resident's note.    CC: Ricky Long. is a 28 y.o. male with hx Crohn's disease who presents concerns for SI.   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   Today he had thought so suicide which alarmed him so he presented to the ED.   He recently moved to the area for work and was laid off. He states he is homeless.    Denies HI, auditory or visual hallucinations.     Exam: Suicidal, poor insight, poor judgement.     Lab: pending    Studies: ekg    Plan: psych admit  1. Suicidal ideation        I was acting as a Neurosurgeon for Shara Blazing, MD on [name],Ricky Long.  Treatment Team: Scribe: Richardine Service    I am the first provider for this patient and I personally performed the services documented. Treatment Team: Scribe: Richardine Service is scribing for me on [name],Ricky Long.Marland Kitchen This note accurately reflects work and decisions made by me.  Shara Blazing, MD        Shara Blazing, MD  12/31/18 [phone]

## 2018-12-29 NOTE — ED Provider Notes (Signed)
Physician/Midlevel provider first contact with patient: 12/29/18 1914         History     Chief Complaint   Patient presents with    Suicidal thoughts    Psychiatric Evaluation     Pt with hx of drug use sober for 6 years relapsed 2 days ago - started using meth  Recently lost his job, homeless  Trying o get into rehab  Also father is sick  Here for SI - thoughts have crossed mind but no plan   Will not contract to safety      The history is provided by the patient. No language interpreter was used.            Past Medical History:   Diagnosis Date    Calculus of kidney     Crohn disease     Hypertension        Past Surgical History:   Procedure Laterality Date    APPENDECTOMY      CHOLECYSTECTOMY      LAPAROSCOPIC, APPENDECTOMY N/A 07/05/2015    Procedure: LAPAROSCOPIC, APPENDECTOMY;  Surgeon: Rosaria Ferries, MD;  Location: [facility];  Service: General;  Laterality: N/A;    THYROIDECTOMY         History reviewed. No pertinent family history.    Social  Social History     Tobacco Use    Smoking status: Current Every Day Smoker     Packs/day: 0.50     Years: 10.00     Pack years: 5.00     Types: Cigarettes    Smokeless tobacco: Never Used   Substance Use Topics    Alcohol use: No    Drug use: Yes     Types: Marijuana     Comment: marijuana, methamphetamines        .     Allergies   Allergen Reactions    Nsaids        Home Medications     Med List Status:  In Progress Set By: Kirke Shaggy, RN at 12/29/2018  7:17 PM        No Medications           Review of Systems   All other systems reviewed and are negative.      Physical Exam    BP: 134/72, Heart Rate: (!) 114, Temp: 99.1 F (37.3 C), Resp Rate: 20, SpO2: 96 %, Weight: 59 kg    Physical Exam  Vitals signs and nursing note reviewed.   Constitutional:       General: He is not in acute distress.     Appearance: He is well-developed. He is not diaphoretic.   HENT:      Head: Normocephalic and atraumatic.      Right Ear: External ear normal.       Left Ear: External ear normal.      Nose: Nose normal.   Eyes:      General:         Right eye: No discharge.         Left eye: No discharge.      Conjunctiva/sclera: Conjunctivae normal.      Pupils: Pupils are equal, round, and reactive to light.   Neck:      Musculoskeletal: Normal range of motion and neck supple.      Trachea: Phonation normal. No tracheal deviation.   Cardiovascular:      Rate and Rhythm: Normal rate and regular rhythm.  Pulses:           Radial pulses are 2+ on the right side and 2+ on the left side.   Pulmonary:      Effort: Pulmonary effort is normal. No respiratory distress.   Musculoskeletal: Normal range of motion.   Skin:     General: Skin is warm and dry.   Neurological:      Mental Status: He is alert and oriented to person, place, and time.      GCS: GCS eye subscore is 4. GCS verbal subscore is 5. GCS motor subscore is 6.      Cranial Nerves: No cranial nerve deficit.   Psychiatric:         Mood and Affect: Mood normal.         Speech: Speech normal.           MDM and ED Course     ED Medication Orders (From admission, onward)    None             MDM  Number of Diagnoses or Management Options     Amount and/or Complexity of Data Reviewed  Clinical lab tests: ordered and reviewed  Discuss the patient with other providers: yes    Risk of Complications, Morbidity, and/or Mortality  Presenting problems: moderate  Diagnostic procedures: moderate  Management options: moderate             Case d/w at liaison who evaluated patient in the emergency room.      Procedures    Clinical Impression & Disposition     Clinical Impression  Final diagnoses:   None        ED Disposition     None           New Prescriptions    No medications on file        Treatment Team: Scribe: Lonia Blood, [place]  12/29/18 2340

## 2018-12-29 NOTE — EDIE (Signed)
COLLECTIVE?NOTIFICATION?12/29/2018 18:59?MANJOT, HINKS [facility]: [id number]    Criteria Met      3 Different Facilities in 90 Days    5 ED Visits in 12 Months    Security and Safety  No recent Security Events currently on file    ED Care Guidelines  There are currently no ED Care Guidelines for this patient. Please check your facility's medical records system.        Prescription Monitoring Program  060??- Narcotic Use Score  030??- Sedative Use Score  000??- Stimulant Use Score  190??- Overdose Risk Score  - All Scores range from 000-999 with 75% of the population scoring < 200 and on 1% scoring above 650  - The last digit of the narcotic, sedative, and stimulant score indicates the number of active prescriptions of that type  - Higher Use scores correlate with increased prescribers, pharmacies, mg equiv, and overlapping prescriptions  - Higher Overdose Risk Scores correlate with increased risk of unintentional overdose death   Concerning or unexpectedly high scores should prompt a review of the PMP record; this does not constitute checking PMP for prescribing purposes.      E.D. Visit Count (12 mo.)  Facility Visits   [facility] 1   [facility] 2   [facility] 1   [facility] 1   [facility] 3   [facility] 1   [facility] 1   [facility] 1   Total 11   Note: Visits indicate total known visits.      Recent Emergency Department Visit Summary  Showing 10 most recent visits out of 11 in the past 12 months  Date Facility [facility] Type Diagnoses or Chief Complaint   Dec 29, 2018 [facility] [place] [facility]      triage-      Nov 22, 2018 [place] [facility] [place] [place] [facility]      flank pain      Flank Pain      Urinary Frequency      Nausea      Nov 13, 2018 [facility] [place] [facility]  Chief Complaint: COUGH    Oct 30, 2018 [facility] [place] [facility]      DENTAL PAIN      Fracture of tooth (traumatic), init for clos fx      2. Cracked tooth      4. Other specified disorders of teeth and supporting structures      5. Nicotine dependence, cigarettes, uncomplicated      6. Allergy status to analgesic agent status      7. Radiographic dye allergy status      Oct 06, 2018 [facility]      Unspecified abdominal pain      Cardiac murmur, unspecified      Frequency of micturition      Dysuria      Aug 22, 2018 [facility] [place] [facility]  Chief Complaint: HA    Aug 09, 2018 [place] [facility] [place] [place] [facility]      side pain/burning w/ urinate      Flank Pain      Dysuria      Calculus of kidney      Aug 07, 2018 [facility]      Possible  UTI      Flank Pain      Calculus of kidney      Strain of muscle, fascia and tendon of lower back, init      Jun 03, 2018 [place] [facility] [place] [place] [facility]      headache      Headache      Other migraine, not intractable, without status migrainosus      Apr 26, 2018 [facility] [place] [place] [facility]  Chief Complaint: [facility]        Recent Inpatient Visit Summary  No recorded inpatient visits.     Care Team  There is not a care team on record at this time.   Collective Portal  This patient has registered at the [facility] Emergency Department   For more information visit: [email or web address]     PLEASE NOTE:     1.   Any care recommendations and other clinical information are provided as guidelines or for historical purposes only, and providers should exercise their own clinical judgment when providing care.    2.   You may only use this information for purposes of treatment, payment or health care operations activities, and subject to the limitations of applicable Collective Policies.    3.   You should consult directly with the  organization that provided a care guideline or other clinical history with any questions about additional information or accuracy or completeness of information provided.    ? 2020 [place], [employer]. - [email or web address]

## 2018-12-29 NOTE — Progress Notes (Signed)
Psychiatric Evaluation Part I    Ricky Long. is a 28 y.o. male admitted to the [facility] Emergency Department who was seen on 12/29/2018 by Larinda Buttery.    Call Details  Patient Location: [facility] ED  Patient Room Number: N33  Time contacted by ED Physician: 2057  Time consult began: 2149  Time (in minutes) from Call to Consult: 52  Time consult concluded: 2220  Referring ED Department  Emergency Department: [facility]        Discharge Planning  Living Arrangements: Parent  Support Systems: Parent  Type of Residence: Homeless  Patient expects to be discharged to:: inpatient    Presenting Mental Status  Orientation Level: Oriented X4, Oriented to place, Oriented to time, Oriented to situation, Oriented to person  Memory: No Impairment  Thought Content: normal  Thought Process: normal  Behavior: normal  Consciousness: Alert  Impulse Control: impaired  Perception: normal  Eye Contact: normal  Attitude: cooperative  Mood: depressed, anxious, hopeless  Describe Hopelessness: Life  Hopelessness Affects Goals: Yes  Hopelessness About Future: Yes  Affect: flat  Speech: soft  Concentration: normal  Insight: fair  Judgment: fair  Appearance: unkempt  Appetite: decreased  Weight change?: normal  Energy: decreased  Sleep: difficulty falling asleep  Reliability of Reporter/Patient: fair    1) Have you wished you were dead or wished you could go to sleep and not wake up?Yes  2) Have you actually had any thoughts of killing yourself? Yes  If yes to 2, ask questions 3,4,5 and 6. If NO to 2, go directly to question 6.  3) Have you been thinking about how you might do this?No  4) Have you had these thoughts and had some intention of acting on them?No  5) Have you started to work out or worked out the details of how to kill yourself? Do you intend to carry out this plan? No  6) Have you ever done anything, started to do anything, or prepared to do anything to end your life? If yes, was this within the past three  months?No    Tool for Assessment of Suicide Risk  Individual Risk Profile: male, age 30-30, psychiatric illness  Symptom Risk Profile: depressive symptoms, anxiety, hopelessness, worthlessness, anhedonia, impulsivity  Interview Risk Profile: recent substance abuse, access to lethal means, suicidal ideation, current problems seem unsolvable to patient  Level of Suicide Risk: High  Justification for level of risk identified based on TSAR results: Passive suicidal ideation. No plan at this time. No history of suicide attempts. Clinical research associate for safety.       Within the Last 6 Months:: no history of violence toward self  Greater than 6 Months Ago:: no history of violence toward self                 Recovery and Support Involvement  Current Involvement: None  Past Involvement: None  Sponsor (Yes/No): No  Have you been prescribed medications to support recovery?: None  Recovery Resources/Support: NA  Support Systems: Parent  Substance Recovery Support  Have you been prescribed medications to support recovery?: None  Recovery Resources/Support: NA  Transportation Available: No  Past Withdrawal Symptoms  Past Withdrawal Symptoms: None  History of Blackouts?: No  History of Withdrawal Seizures?: No    Preliminary Diagnosis #1: F33.1 Major Depressive Disorder  Preliminary Diagnosis #2: None  Preliminary Diagnosis #3: None  Preliminary Diagnosis #4: None  Violence Toward Others  Within the Last 6 Months:: no history  of violence toward others  Greater than 6 Months Ago:: no history of violence toward others  Preliminary Diagnosis #5: None  Preliminary Diagnosis (DSM IV)  Axis I: F33.1 Major Depressive Disorder  Axis II: deferred  Axis III: see medical chart  Axis IV: Primary support group, Social environment, Museum/gallery curator, Occupational, Housing  Axis V on Admission: 25  Axis V - Highest in Past Year: Unk        Summary: Summary: Pt is a 28 yo single Caucasian male recommended for inpatient treatment after coming to the ED for  suicidal ideation.  Pt denies a current plan but cannot contract for safety. Pt denies any previous suicide attempts. Pt denies access to firearms.  Pt denies homicidal ideation, self-injurious behavior and hallucinations. Pt is alert and oriented x4. Mood is depressed. Affect is congruent.  Eye contact is fair.  No impatient to speech, thought process or thought content noted.   Current mental health symptoms: Sadness, hopelessness, worthlessness, guilt, regret, shame, helplessness and feelings of being overwhelmed. Pt reports that he has not slept or eaten in two days.    History of SI/HI: None  Supports: Father  Current Major Stressors: Unemployment, financial strain, homelessness, lack of social supports, lack of mental health providers, recent relapse on substances, and father's illness.    Current Mental Health Treatment: None  Medications: None  Treatment History: No history of inpatient or outpatient treatment.    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  Pt reports daily cannabis use.  Pt denies any history of withdrawals to include blackouts, dt's and seizures.  BAL is 0 and UDS is positive for cannabis.  Familial History: None  Living Situation: Homeless.   Violence/Aggression: None reported  Trauma: None   Vitals: BP: 134/72, Heart Rate: 114, Temp: 99.1 F (37.3 C), Resp Rate: 20, SpO2: 96 %    Informed Consent  Circumstances surrounding inpatient psychiatric treatment were discussed with patient:  Belongings will be searched, cell phone or other electronic devices are not permitted, the unit is locked, medications are administered by the hospital, controlled substances are generally not prescribed, and discharge must be discussed with treatment team.     Disposition: Pt is voluntary for inpatient treatment. Pt medically cleared by PA Ashna who is in agreement with the plan. Pt accepted to [facility] by Dr.  Lyman Bishop    Justification for disposition: Passive suicidal ideation. No plan at this time. No history of suicide attempts. Clinical research associate for safety.     If patient is voluntarily admitted to an [place] inpatient psychiatric unit and decides to leave AMA within the first 8 hours on the unit, is there an identified petitioner?No    Name of Petitioner:NA  Contact Information:NA  If no petitioner is identified, please explain why not: Pt meets criteria for inpatient admission due to worsening depression and passive suicidal ideation. However, pt denies plan or intent and does not meet TDO criteria.     Insurance Pre-authorization information:None    Was consent for voluntary admission obtain and scanned into EPIC? Yes       By whom? [facility]  [address]  [place], [place] [address]  [phone]

## 2018-12-30 DIAGNOSIS — F1220 Cannabis dependence, uncomplicated: Secondary | ICD-10-CM | POA: Diagnosis present

## 2018-12-30 DIAGNOSIS — F32A Depression, unspecified: Secondary | ICD-10-CM | POA: Diagnosis present

## 2018-12-30 DIAGNOSIS — F1520 Other stimulant dependence, uncomplicated: Secondary | ICD-10-CM | POA: Diagnosis present

## 2018-12-30 LAB — ECG 12-LEAD
Atrial Rate: 93 {beats}/min
P Axis: 62 degrees
P-R Interval: 124 ms
Q-T Interval: 348 ms
QRS Duration: 82 ms
QTC Calculation (Bezet): 432 ms
R Axis: 61 degrees
T Axis: 39 degrees
Ventricular Rate: 93 {beats}/min

## 2018-12-30 MED ORDER — QUETIAPINE FUMARATE 100 MG PO TABS
100.0000 mg | ORAL_TABLET | Freq: Every evening | ORAL | Status: DC
Start: 2018-12-30 — End: 2018-12-31
  Administered 2018-12-30: 21:00:00 100 mg via ORAL
  Filled 2018-12-30: qty 1

## 2018-12-30 MED ORDER — TRAZODONE HCL 50 MG PO TABS
50.0000 mg | ORAL_TABLET | Freq: Every evening | ORAL | Status: DC | PRN
Start: 2018-12-30 — End: 2019-01-03
  Administered 2018-12-31 – 2019-01-02 (×3): 50 mg via ORAL
  Filled 2018-12-30 (×3): qty 1

## 2018-12-30 MED ORDER — CETIRIZINE HCL 10 MG PO TABS
10.0000 mg | ORAL_TABLET | Freq: Every day | ORAL | Status: DC
Start: 2018-12-30 — End: 2019-01-04
  Administered 2018-12-30 – 2019-01-04 (×6): 10 mg via ORAL
  Filled 2018-12-30 (×6): qty 1

## 2018-12-30 MED ORDER — HYDROXYZINE PAMOATE 25 MG PO CAPS
50.00 mg | ORAL_CAPSULE | ORAL | Status: DC | PRN
Start: 2018-12-30 — End: 2019-01-04
  Administered 2018-12-30 – 2019-01-04 (×6): 50 mg via ORAL
  Filled 2018-12-30 (×6): qty 2

## 2018-12-30 MED ORDER — FLUTICASONE PROPIONATE 50 MCG/ACT NA SUSP
1.00 | Freq: Every day | NASAL | Status: DC
Start: 2018-12-30 — End: 2019-01-04
  Administered 2018-12-30 – 2019-01-04 (×6): 1 via NASAL
  Filled 2018-12-30: qty 16

## 2018-12-30 MED ORDER — ALBUTEROL SULFATE 1.25 MG/3ML IN NEBU
1.25 mg | INHALATION_SOLUTION | Freq: Four times a day (QID) | RESPIRATORY_TRACT | Status: DC | PRN
Start: 2018-12-30 — End: 2019-01-04
  Filled 2018-12-30 (×4): qty 3

## 2018-12-30 MED ORDER — HYDROXYZINE PAMOATE 25 MG PO CAPS
25.00 mg | ORAL_CAPSULE | ORAL | Status: DC | PRN
Start: 2018-12-30 — End: 2018-12-30

## 2018-12-30 MED ORDER — DOCUSATE SODIUM 100 MG PO CAPS
100.00 mg | ORAL_CAPSULE | Freq: Two times a day (BID) | ORAL | Status: DC | PRN
Start: 2018-12-30 — End: 2019-01-04

## 2018-12-30 MED ORDER — ALUM & MAG HYDROXIDE-SIMETH 200-200-20 MG/5ML PO SUSP
30.00 mL | Freq: Four times a day (QID) | ORAL | Status: DC | PRN
Start: 2018-12-30 — End: 2019-01-04

## 2018-12-30 MED ORDER — ONDANSETRON HCL 4 MG PO TABS
4.0000 mg | ORAL_TABLET | Freq: Three times a day (TID) | ORAL | Status: DC | PRN
Start: 2018-12-30 — End: 2019-01-04

## 2018-12-30 MED ORDER — ACETAMINOPHEN 325 MG PO TABS
650.0000 mg | ORAL_TABLET | Freq: Four times a day (QID) | ORAL | Status: DC | PRN
Start: 2018-12-30 — End: 2019-01-04
  Administered 2019-01-04: 650 mg via ORAL
  Filled 2018-12-30: qty 2

## 2018-12-30 NOTE — UM Notes (Signed)
12/30/2018 9:53 am Initial review for admission date of 12/29/2018 at 2312    Legal Status: Voluntary    Per Chart notes of psych liaison, Suszanne Finch: Pt is a 28 yo single Caucasian male recommended for inpatient treatment after coming to the ED for suicidal ideation.  Pt denies a current plan but cannot contract for safety. Pt denies any previous suicide attempts. Pt denies access to firearms.  Pt denies homicidal ideation, self-injurious behavior and hallucinations. Pt is alert and oriented x4. Mood is depressed. Affect is congruent.  Eye contact is fair.  No impatient to speech, thought process or thought content noted.   Current mental health symptoms: Sadness, hopelessness, worthlessness, guilt, regret, shame, helplessness and feelings of being overwhelmed. Pt reports that he has not slept or eaten in two days.    Supports: Father  Current Major Stressors: Unemployment, financial strain, homelessness, lack of social supports, lack of mental health providers, recent relapse on substances, and father's illness.    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  Pt reports daily cannabis use.  Pt denies any history of withdrawals to include blackouts, dt's and seizures.  BAL is 0 and UDS is positive for cannabis.  Familial History: None  Living Situation: Homeless.   Violence/Aggression: None reported  Trauma: None   Vitals: BP: 134/72, Heart Rate: 114, Temp: 99.1 F (37.3 C), Resp Rate: 20, SpO2: 96 %  Presenting Mental Status  Orientation Level: Oriented X4, Oriented to place, Oriented to time, Oriented to situation, Oriented to person  Memory: No Impairment  Thought Content: normal  Thought Process: normal  Behavior: normal  Consciousness: Alert  Impulse Control: impaired  Perception: normal  Eye Contact: normal  Attitude: cooperative  Mood: depressed, anxious,  hopeless  Describe Hopelessness: Life  Hopelessness Affects Goals: Yes  Hopelessness About Future: Yes  Affect: flat  Speech: soft  Concentration: normal  Insight: fair  Judgment: fair  Appearance: unkempt  Appetite: decreased  Weight change?: normal  Energy: decreased  Sleep: difficulty falling asleep  Reliability of Reporter/Patient: fair    Preliminary Diagnosis (DSM IV): per psych liaison notes Kandee Keen C:  Axis I: F33.1 Major Depressive Disorder  Axis II: deferred    Medications psych scheduled:  prns at this time; no scheduled psych med    Regular diet  Safety tray all meals  Activity as tolerated  Close q 15 min checks  V/s bid  Suicide precautions    Review [place]  Medicaid(email to PFS as 3 listings have same subscriber#)  Iantha Fallen, RN,BSN  UR Case Manager, Psychiatry  [employer]  [phone] Phone  [phone] Fax UR Dept

## 2018-12-30 NOTE — H&P (Signed)
Psychiatry Admission    Patient Name: Ricky Long.            Current Date/Time:  2/20/20205:04 PM  MRN:  [id number]                            Admission Date/Time: 12/29/2018  8:24 PM  DOB: [birth year]                              Admitting Physician: Leary Roca, MD     Gender: male                          Attending Physician: Glynis Smiles, MD    I. History   Informants: the pt the chart and the team   A.Chief Complaint or Reason for Admission        I was "heatign myself up about the relapse"     B.History of Present Illness     (Symptoms and qualifiers:1-3 for brief, at least 4 for extended)    Patient is a 28 y.o. 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 He had a 4 year hx of use of 1.5 gram daily of meth for 4 years. thisw as from 20 to 24. He then went to a rehab and was doing well until 3 weeks ago when he relapsed.he reports his primary stressor was that last week he lost his job and he could not find other work. He was living with his father who reportedly has AD and he is not able to live with his father because he can not find work in [place]. Certainly, this will need to be clarified. He also has sig stress related to his ex wife ( who left him 8 years ago) related to his drug use. He has been unable tos ee his 56 year old son for 8 years since she moved to [place]. He went to rehab adnw as working and was hoping to get some rights with his son and he had been unable to do so and with the loss of his jon he felt more hopeless and helpless and guily and relapsed. He was having sadness and anxiety prior to the job loss related to his dads illness and not seeing his son. He however at that time did  Appear to meet the [place]D criteria. He does endorse usign the meth at 20 because it was the only thing thing that helped his mood.he then did have distict episodes where he felt anhedonic anergia helpless hopeless dan guilty. He has had no  formal psychiatric intervention except in the context of substance use. He has been o n vistaril for anxiety at rehab but no other medications. He does endorse a childhood hx of ADHD and can not remeber how he was dxd.he remembers feeling anxious related to his parents divorce when he was young and not able to see his dad. He developed UC which is well controlled ( diet ) now.  He currently reports having a medical mj card for MJ to tx his ADHD. However has legal charges pending possession of MJ and has a court date in April. Last night at time of admission he was feeling hopeless and helpless and guilty related to there elapse but is interested ing etting into a rehabb. He  is also hoping to look  t skills and medicatons that can help him modulate his mood better. He does endorse 1 period of time when he was not using where he felt powerful and did nto need much sleep and was very productive., he has no psychotic hx.    On interview today he is certainly anxious and restless dan hypervigilant but no overt paranoia. He is cooeprative. He feels safe in the [place] but not on [place]. He has no active intent or plan. He has no hi or avh. He agrees to seroquel and   Was given consent with risks and benefits       C.1. Past Psychiatric History  Known psychiatric diagnoses: NA  Outpatient provider (current / recent): NA  Past known / recent medication trials: vistaril   Hospitalizations (total number / most recent): na  Previous suicide attempts: NA  Case management services (name and contact number): Unknown    C.2.Substance Use History  Pt has tried heroin by inhalation lsd and PCP and cocaine by smoking as a teen nothing current.     C.3.Medical History  Review of Systems  (Extended 2-9, Complete 10 or more)  Psychiatric: Depression and Anxiety  Constitutional: Fatigue    Allergies:   Allergies   Allergen Reactions   . Nsaids      Medications:   Prior to Admission medications    Not on File     Past Medical History:    Diagnosis Date   . Calculus of kidney    . Crohn disease    . Hypertension      Past Surgical History:   Procedure Laterality Date   . APPENDECTOMY     . CHOLECYSTECTOMY     . LAPAROSCOPIC, APPENDECTOMY N/A 07/05/2015    Procedure: LAPAROSCOPIC, APPENDECTOMY;  Surgeon: Rosaria Ferries, MD;  Location: [facility];  Service: General;  Laterality: N/A;   . THYROIDECTOMY         Family History:   None reported    Social History  Born and raised in [place]. He has a GED. He is divorced and was recently living with his dad. Until  he lost his job. He has 1 son Dominick who is 10 and lvies in [facility] with Mom. Pt denies any trauma hx   II. Examination   Vital signs reviewed:   Blood pressure 115/81, pulse 86, temperature 97.9 F (36.6 C), temperature source Oral, resp. rate 16, height 1.575 m (5\' 2" ), weight 56.7 kg (125 lb), SpO2 97 %.     Mental Status Exam  General appearance: Appears chronological age, Poor hygiene and Poor grooming  Attitude/Behavior: Calm and Cooperative  Motor: Other acute abnormalities: restless  Gait: No obvious abnormalities  Muscle strength and tone: Not tested  Speech:   Spontaneous: Yes  Rate and Rhythm: Increased  Volume: Normal  Tone: Normal  Clarity: Yes  Mood: down  Affect:   Range: intense  Stability: Stable  Appropriateness to thought content: Yes  Intensity: Intense  Thought Process:   Coherent: Yes  Logical:  Yes  Associations: Goal-directed  Thought Content:   Delusions:  Not elicited  Depressive Cognitions:  Hopeless, Helpless, Worthless and Guilt  Suicidal:  No suicidal thoughts  Homicidal:  No homicidal thoughts  Violent Thoughts:  No  Perceptions:   Dissociative Phenomena:  No  Hallucinations: No  Insight: Poor  Judgment: Poor  Cognition:   Level of Consciousness: Intact  Orientation: Intact to self, place and time  Recent Memory: Intact  Remote Memory: Intact  Attention and Concentration: Intact  Fund of Knowledge: Good    Psychiatric / Cognitive Instruments: None      Imaging / EKG /  Labs:   Labs in the last 24 hours  Results     Procedure Component Value Units Date/Time    TSH [[id number]] Collected:  12/29/18 2019    Specimen:  Blood Updated:  12/29/18 2128     TSH 0.77 uIU/mL     Ethanol (Alcohol) Level [[id number]] Collected:  12/29/18 2019    Specimen:  Blood Updated:  12/29/18 2114     Alcohol None Detected mg/dL     Acetaminophen level [[id number]]  (Abnormal) Collected:  12/29/18 2019    Specimen:  Blood Updated:  12/29/18 2114     Acetaminophen Level <7 ug/mL     Salicylate level [[id number]]  (Abnormal) Collected:  12/29/18 2019    Specimen:  Blood Updated:  12/29/18 2114     Salicylate Level <5.0 mg/dL     GFR [[id number]] Collected:  12/29/18 2019     Updated:  12/29/18 2114     EGFR >60.0    Comprehensive metabolic panel [[id number]] Collected:  12/29/18 2019    Specimen:  Blood Updated:  12/29/18 2114     Glucose 77 mg/dL      BUN 53.6 mg/dL      Creatinine 0.9 mg/dL      Sodium 644 mEq/L      Potassium 4.1 mEq/L      Chloride 101 mEq/L      CO2 24 mEq/L      Calcium 10.0 mg/dL      Protein, Total 7.8 g/dL      Albumin 4.6 g/dL      AST (SGOT) 28 U/L      ALT 15 U/L      Alkaline Phosphatase 61 U/L      Bilirubin, Total 0.6 mg/dL      Globulin 3.2 g/dL      Albumin/Globulin Ratio 1.4    Magnesium [[id number]] Collected:  12/29/18 2019    Specimen:  Blood Updated:  12/29/18 2114     Magnesium 1.8 mg/dL     Phosphorus [[id number]] Collected:  12/29/18 2019    Specimen:  Blood Updated:  12/29/18 2114     Phosphorus 4.2 mg/dL     Urine Rapid Drug Screen [[id number]]  (Abnormal) Collected:  12/29/18 2019    Specimen:  Urine Updated:  12/29/18 2105     Amphetamine Screen, UR Negative     Barbiturate Screen, UR Negative     Benzodiazepine Screen, UR Negative     Cannabinoid Screen, UR Positive     Cocaine, UR Negative     Opiate Screen, UR Negative     PCP Screen, UR Negative    CBC and differential [[id number]]  (Abnormal) Collected:  12/29/18 2019    Specimen:  Blood Updated:  12/29/18 2040      WBC 8.72 x10 3/uL      Hgb 13.5 g/dL      Hematocrit 09.3 %      Platelets 353 x10 3/uL      RBC 4.47 x10 6/uL      MCV 90.8 fL      MCH 30.2 pg      MCHC 33.3 g/dL      RDW 13 %      MPV 8.4 fL      Neutrophils 57.7 %  Lymphocytes Automated 36.4 %      Monocytes 4.9 %      Eosinophils Automated 0.5 %      Basophils Automated 0.2 %      Immature Granulocyte 0.3 %      Nucleated RBC 0.0 /100 WBC      Neutrophils Absolute 5.03 x10 3/uL      Abs Lymph Automated 3.17 x10 3/uL      Abs Mono Automated 0.43 x10 3/uL      Abs Eos Automated 0.04 x10 3/uL      Absolute Baso Automated 0.02 x10 3/uL      Absolute Immature Granulocyte 0.03 x10 3/uL      Absolute NRBC 0.00 x10 3/uL         EKG Results  Cardiology Results     Procedure Component Value Units Date/Time    ECG 12 Lead [[id number]] Collected:  12/29/18 1925     Updated:  12/30/18 1238     Ventricular Rate 93 BPM      Atrial Rate 93 BPM      P-R Interval 124 ms      QRS Duration 82 ms      Q-T Interval 348 ms      QTC Calculation (Bezet) 432 ms      P Axis 62 degrees      R Axis 61 degrees      T Axis 39 degrees     Narrative:       NORMAL SINUS RHYTHM  NORMAL ECG  NO PREVIOUS ECGS AVAILABLE  Confirmed by Neomia Dear MD, JOHN ([id number]) on 12/30/2018 12:38:38 PM            III. Assessment and Plan (Medical Decision Making)     1. I certify that this patient requires inpatient hospitalization due to acute risk to self    2. 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 Calculus of kidney    . Crohn disease    . Hypertension      Past Surgical History:   Procedure Laterality Date   . APPENDECTOMY     . CHOLECYSTECTOMY     . LAPAROSCOPIC, APPENDECTOMY N/A 07/05/2015    Procedure: LAPAROSCOPIC, APPENDECTOMY;  Surgeon:  Rosaria Ferries, MD;  Location: [id number];  Service: General;  Laterality: N/A;   . THYROIDECTOMY        Axis IV   Problems with primary support group   Problems related to the social environment   Other psychosocial and environmental problems   Axis V   GAF at  Admission: 41-50: serious symptoms.      3.Labs reviewed and compared to prior labs in the system. Past medical records reviewed. Coordination of care was discussed with inpatient team and as available with the outpatient team.    4. Assessment / Impression  Patient is a 28 y.o. Caucasian male with a history of recent relapse to methamphetamines after 4 years of sobrety. This is int he context of loss of job family issues and now homelessness.   He agrees to a trial of seroqeul for mood and anxiety and wishes rehab placement   Suicide Risk Assessment  Suicide Thoughts / Behaviors: Yes  Current Plan: None  Access to firearm: No  Past suicide attempts: None  Diagnosis of MDD, BPAD, Schizophrenia, Cluster B PD, Anorexia, Substance Use: Yes, mdd sa.  History of CNS disease, Cancer, HIV, Chronic Pain, ESRD, COPD, SLE: Yes, uc.  Psychosocial Stressors: Yes, job loss family concerns.  Physical Trauma: No  Emotional Trauma: No  Sexual Trauma: No  Family history of suicide: No  Psychological Features: Hopeless and Impulsive  Currently Intoxicated: No  Demographic Risk Factors: No  Protective Factors: Sense of responsibility to family    Plan / Recommendations:  Patient admitted to inpatient psychiatric unit on voluntary status for further diagnostic and safety evaluations, clinical stabilization with psychotropic treatment and non-pharmacological interventions, and for discharge planning.    Biological Plan:  Medications: seroquel 100 HS for mood and sleep   Medical Work-up: None required at this time  Consults: None required at this time    Psychosocial Plan:  Individual therapy: Psycho-education and psychotherapy of the following modalities will be  provided during daily visits:Supportive  Group and milieu therapies: daily per unit's schedule.  Social Work intervention will be provided for discharge planning and will include assistance with follow-up psychiatric care.      Total Attending time spent 70 minutes (floor time) with more than 50 percent of time in direct patient contact, coordinating care and counseling.    Signed by: Rayvon Char, NP  12/30/2018  5:04 PM

## 2018-12-30 NOTE — Psych Admission Note (Signed)
Nurse SBAR Admission Note:    Situation:  Pt is a 28 y/o Caucasian male who presented to the ED with suicidal ideation with no plan. 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    Patient's expectations / goals for inpatient treatment:  Long term goal: "I really don't know"  Short term goal(s): "I don't know yet"    Background: Patient has no prior inpatient or outpatient psychiatric treatment. He reports history of substance abuse treatment 2 years ago at [facility] in [place].  He reports a medical history of Crohns disease and Kidney. Patient reports that he has been living in his car for the past 3 days. He reports living with his father in [place] up until 3 days ago but came to [place] to look for work.   Current V/S:    Vitals:    12/30/18 0045   BP: 113/80   Pulse: 81   Resp: 16   Temp: 97.6 F (36.4 C)   SpO2: 100%     Current known Allergies:  Nsaids    Assessment:  Patient is alert and oriented x4, presents with a blunted affect and depressed mood. He denies SI/HI/AVH upon assessment and reports feeling safe on unit.    Current suicide risk (admission C-SSR-S screen and SAFE-T assessment):      1. Have you wished you were dead or could go to sleep and not wake up?YES  2. Have you had any thoughts of killing yourself?      YES        If YES to 2, ask questons 3,4,5, and 6.  If NO to 2, do directly to question 6    3. Have you been thinking about how you might do this?NO                     4. Have you had these thoughts and had some intention of acting on them? NO   5. Have you started to work out or worked out the details of how to         kill yourself?NO Do you intend to carry out this plan?  NO                                                   6. Have you ever done anything , started to do anything, or prepared to do        anything to end you life? NO                                                                                   If YES, ask: Was this in the past three months?   N/A                            Describe  Specific Questioning About Thoughts, Plans, and Suicidal Intent  How many times have you had these thoughts? (Past Month): Less than once a week  When you have the thoughts how long do they last? (Past Month): Less than 1 hour/some of the time  Could/can you stop thinking about killing yourself or wanting to die if you want to? (Past Month): Can control thoughts with some difficulty  Are there things - anyone or anything (e.g. family, religion, pain of death) - that stopped you from wanting to die or acting on thoughts of suicide? (Past Month): Uncertain that deterrents stopped you  What sort of reasons did you have for thinking about wanting to die or killing yourself?  Was it to end the pain or stop the way you were feeling (in other words you couldn't go on living with this pain or how you were feeling) or was it to get attention: Mostly to end or stop the pain (you couldn't go on living with the pain or how you were feeling)  Total Score of Intensity of Ideation: 13   Step 2: Identify Risk Factors  Clinical Status (Current/Recent): Insomnia, Hopelessness or despair  Clinical Status (Lifetime): Sexual/physical abuse, Substance/Alcohol Abuse or Dependence  Precipitants/Stressors: Triggering events leading to humiliation, shame, and/or despair (e.g. Loss of relationship, financial or health status) (real or anticipated), Substance intoxication or withdrawal  Treatment History / Dx: Other (comment)(Substance abuse treatment 2 years ago)  Access to lethal methods: No, does not have access to lethal methods  Step 3: Identify Protective Factors  Internal: Patient denies  External: Supportive social networks or family  Step 4: Guidelines to Determine Level of Risk  Suicide Risk Level: Low  Step 5: Possible Interventions to LOWER Risk Level  Behavioral Health  Ambulatory, or Emergency Department: Develop safety plan  Behavioral Health Inpatient Unit: L/M/H - Reassess Q shift - C-SSRS frequent screener, L/M/H - Q 15 min in person checks       Rationale for suicide risk level : Patient denies SI/HI/AVH.     Recommendations:Presenting suicide risk assessment results and risk level were discussed with: Dr Lyman Bishop (admitting physician).  Patient placed on Low Suicide Alert Level  Initial safety precautions :  Q 15 min observations, 1:1 constant observation, belongings search, develop in-hospital safety plan, educated to tell staff if any increase in suicidal ideation)     Luisa Hart RN

## 2018-12-30 NOTE — Treatment Plan (Signed)
Interdisciplinary Treatment Plan Update Meeting    12/30/2018  [name].    Participants:  Patient:  Ricky Long.  Attending Physician:  Glynis Smiles, MD  Other: Darden Palmer    Short Term Goal: Med compliance and participating in groups  Long Term Goal: family involvement for discharge plan    Objective:  Review response to treatment, reassess needs/goals, update plan as indicated incorporating patient's strengths and stated needs, goals, and preferences.    1. Summary of Patient Progress on Treatment Plan Goals:  Chrissie Noa participated in treatment plan round today. Patient described his mood "tired". Reported lack of sleep last night because of admission process. He denied SI/HI/AVH. He endorsed being anxious and depressed. He feels safe on the unit. He went back to bed after breakfast and slept until lunch time. More visible after lunch, attending groups. Med compliant. Vistaril 50 mg given for anxiety at 1430 with moderate effect. The plan is to start Seroquel 100 mg bed time and Trazodone 50 mg PRN for sleep and monitor the patient.      2. Level of Patient Involvement:  Actively engaged/contributing    3. Patient Understanding of Plan of Care:  Able to verbalize goals and interventions, Able to verbalize discharge criteria    4. Level of Agreement/Commitment to Plan of Care:  Agrees with and is committed to plan of care          Contributor Signatures:      MD_________________________________ Date___________________    SW_________________________________Date ___________________    RN _________________________________Date____________________    Patient_______________________________Date____________________    Other________________________________Date ___________________    Other________________________________Date ___________________    (This document is signed electronically by Clinical research associate and electronic co-signer.  Other participants sign a printed copy which is scanned into  the EMR)

## 2018-12-30 NOTE — Progress Notes (Signed)
Daily Note: 12/30/18    Affect/Mood:          Anxious    Thought Process:  Logical    Thought Content:   Within Normal Limits    Interpersonal:         Attentive      Treatment Plan Goal Addressed:   Ricky Long will verbalize reduction of self-harm thoughts and make no attempts to hurt self while hospitalized.    Intervention Used and Patient Response:   Therapist was not able to assist patient in completing in-hospitalization safety plan. Therapist will meet with patient to complete in-hospitalization safety plan tomorrow. However, therapist was able to encourage patient to attend groups throughout the day. Patient reported that he wanted to sleep through out the day. Patient stated, "I got here at 3:30 last night." Therapist will reinforce patient's use/efforts to use healthy coping strategies during hospitalization.    Patient Participation in Groups:  During creative therapy patient appeared focused while looking for beads to make a bracelet. Patient shuffled through the container of beads for about 15 minuets. Patient completed his bractlet then discussed difficulty tying it with a peer. Patient also discussed difficulty keeping his pants at his waist without a belt. Peers provided validation and encouraged patient to talk to his nurse. Therapist reinforced patients attendance in group. Patient has not attended any other groups today. Therapist will continue to encourage group attendance and participation in order to increase positive coping skills, and express feelings regarding hospitalization.       Safety Plan:   Not completed on December 30, 2018.    Daily Group Attendance Note:    Orientation and Goals:       Attended No    Community Meeting:          Attended No    Psych-ed Group:                 Attended No    Group Therapy:                   Attended No    Creative Therapy:               Attended Yes

## 2018-12-30 NOTE — Plan of Care (Signed)
Problem: Safety  Goal: Patient will be free from injury during hospitalization  Outcome: Progressing   Pt was received lying in bed with eyes closed and appeared to be sleeping. Pt woke at 2100. Denied all psych symptoms including pain. Took his medications with no problem. Asked for some snacks and juice which he ate and returned to bed. Remained isolative in his room. Staff will continue to monitor pt Q 15 mins for safety and assist as needed.

## 2018-12-30 NOTE — Progress Notes (Signed)
Patient went to sleep after admission assessment and has remained sleeping throughout the night, no distress noted. Will continue to monitor patient for safety.

## 2018-12-30 NOTE — H&P (Signed)
ADMISSION HISTORY AND PHYSICAL EXAM    Date Time: 12/30/18 4:17 PM  Patient Name: Ricky Long,Ricky Long Liliana Cline.  Attending Physician: Glynis Smiles, MD    Assessment:   WM is a 28 year old male with history of appendectomy, kidney stones, crohn's disease (no flare for several years per patient), and hypertension, and substance abuse. He denies history of thyroidectomy or cholecystectomy. He reports that he has been sober x 4 years until recently when he relapsed on methamphetamine. He reports that he went to rehab 4 years ago and then remained sober for a while. When he relapsed, he also lost his job and has been living out of his car for several days. He reports that he was staying with his father in [place] but came to [place] to find work. Other than relapsing on methamphetamine, patient denies recreational use of any other drugs, though admits that in [place], has a prescription for marijuana. He denies alcohol use and reports that he quit smoking cigarettes 2 years ago. Patient also notes that he has not been sexually active. Currently, patient denies SI/HI/AVH though admits to still feeling stressed due to work and his finances suffering due to lack of employment. He also complains of an ongoing cough and post nasal drip x 2-3 weeks. He reports that the cough is mostly dry though occasionally will expectorate some phlegm. He also complains of post nasal drip that is mostly worse when he is outdoors. He denies fever, chills, body aches, fatigue; denies CP, SOB. He denies alleviating or exacerbating factors. He seeks care at this time.   Plan:   1.) Persistent cough and posterior rhinorrhea.  - Auscultated lungs posteriorly and anteriorly, and lungs are clear in all lobes.  - Suspect that post nasal drip and allergic rhinitis cause of patient's persistent cough. Recommend the following to manage:   - Start Flonase daily x 1 week for therapeutic trial for management of rhinitis.     - Start antihistamine  such as Zyrtec for management of suspected allergic rhinitis.    - Patient reports history of asthma. Start Albuterol PRN for persistent cough and SOB.    - Continue to monitor for persistent or worsening signs and symptoms.   - Will continue to follow this problem as needed.  2.) Physical exam unremarkable. VSS. Reviewed most recent serologies, urine studies, and 12 lead ECG, and they too are unremarkable.  3.) Continue with plan of care as outlined by psychiatric treatment team.  4.) Will continue to follow along as needed.   Past Medical History:     Past Medical History:   Diagnosis Date   . Calculus of kidney    . Crohn disease    . Hypertension        Past Surgical History:     Past Surgical History:   Procedure Laterality Date   . APPENDECTOMY     . CHOLECYSTECTOMY     . LAPAROSCOPIC, APPENDECTOMY N/A 07/05/2015    Procedure: LAPAROSCOPIC, APPENDECTOMY;  Surgeon: Rosaria Ferries, [place];  Location: [facility];  Service: General;  Laterality: N/A;   . THYROIDECTOMY         Family History:   History reviewed. No pertinent family history.    Social History:     Social History     Socioeconomic History   . Marital status: Single     Spouse name: Not on file   . Number of children: Not on file   . Years of education:  Not on file   . Highest education level: Not on file   Occupational History   . Not on file   Social Needs   . Financial resource strain: Not on file   . Food insecurity:     Worry: Not on file     Inability: Not on file   . Transportation needs:     Medical: Not on file     Non-medical: Not on file   Tobacco Use   . Smoking status: Former Smoker     Types: Cigarettes     Last attempt to quit: 11/10/2016     Years since quitting: 2.1   . Smokeless tobacco: Never Used   Substance and Sexual Activity   . Alcohol use: No   . Drug use: Yes     Types: Marijuana, Methamphetamines     Comment: marijuana, methamphetamines    . Sexual activity: Not on file   Lifestyle   . Physical activity:     Days per week:  Not on file     Minutes per session: Not on file   . Stress: Not on file   Relationships   . Social connections:     Talks on phone: Not on file     Gets together: Not on file     Attends religious service: Not on file     Active member of club or organization: Not on file     Attends meetings of clubs or organizations: Not on file     Relationship status: Not on file   . Intimate partner violence:     Fear of current or ex partner: Not on file     Emotionally abused: Not on file     Physically abused: Not on file     Forced sexual activity: Not on file   Other Topics Concern   . Not on file   Social History Narrative   . Not on file       Allergies:     Allergies   Allergen Reactions   . Nsaids        Medications:     No medications prior to admission.       Review of Systems:   A comprehensive review of systems was:     Constitutional: Patient denies fever, chills, headaches, fatigue.   Neuro: Denies headaches, dizziness, visual disturbances, auditory disturbances, coordination difficulties, muscle weakness, paresthesias.  Psychiatric: Currently denies SI/HI/AVH.   Cardiovascular: Denies chest pain, extremity swelling, palpitations.  Pulmonary: Denies SOB, wheezing. Complains of ongoing cough (see note above).  ENT: No ear pain, sinus pain/congestion, or sore throat. Complains of post nasal drip.    Gastrointestinal: Denies nausea, vomiting, diarrhea, constipation, or abdominal pain.  Genitourinary: Denies hematuria, pyuria, dysuria, urinary frequency, hesitancy.  Integumentary: Denies any rash or lesions. No wounds.   Musculoskeletal: Denies any muscle pain. Denies any injury.    Physical Exam:     Vitals:    12/30/18 1527   BP: 115/81   Pulse: 86   Resp:    Temp: 97.9 F (36.6 C)   SpO2: 97%       Intake and Output Summary (Last 24 hours) at Date Time  No intake or output data in the 24 hours ending 12/30/18 1617    General: Patient well nourished and of appropriate weight.   Head: No wounds. Normocephalic.    Eyes: Eyes ERRL. Accomodation is WNL. Eyes are anicteric.   ENT: Mucous membranes are moist  and intact.   Neck: Trachea midline.   Neuro: A&Ox4. CNll-CNXll examined & WNL. Patellar DTR 2+ bilaterally. Patient demonstrates normal strength in upper and lower extremities. Patient ambulating well. No visible tremor.   Cardiac: Heart is regular. Rate is WNL. No murmur, rubs, or gallops. Peripheral pulses are full and intact. Cap refill < 3 seconds in extremities. No lower extremity swelling or redness.   Pulmonary: Lungs clear in all lobes anteriorly and posteriorly. No signs of SOB or dyspnea.   GI: Normoactive bowel sounds. Abdomen is soft and non-tender to touch. No distention or protuberance noted.   GU: This portion of PE inappropriate as patient denies issues in this system.   SKIN: No rashes, lesions, or wounds noted.   MS: FROM both passively and actively. Patient has 5+ strength in extremities. Ambulating well.  Psych: Patient is calm, cooperative, and answered all questions appropriately. He currently denies SI/HI/AVH.     Labs:     Results     Procedure Component Value Units Date/Time    TSH [[id number]] Collected:  12/29/18 2019    Specimen:  Blood Updated:  12/29/18 2128     TSH 0.77 uIU/mL     Ethanol (Alcohol) Level [[id number]] Collected:  12/29/18 2019    Specimen:  Blood Updated:  12/29/18 2114     Alcohol None Detected mg/dL     Acetaminophen level [[id number]]  (Abnormal) Collected:  12/29/18 2019    Specimen:  Blood Updated:  12/29/18 2114     Acetaminophen Level <7 ug/mL     Salicylate level [[id number]]  (Abnormal) Collected:  12/29/18 2019    Specimen:  Blood Updated:  12/29/18 2114     Salicylate Level <5.0 mg/dL     GFR [[id number]] Collected:  12/29/18 2019     Updated:  12/29/18 2114     EGFR >60.0    Comprehensive metabolic panel [[id number]] Collected:  12/29/18 2019    Specimen:  Blood Updated:  12/29/18 2114     Glucose 77 mg/dL      BUN 44.0 mg/dL      Creatinine 0.9 mg/dL      Sodium 347 mEq/L       Potassium 4.1 mEq/L      Chloride 101 mEq/L      CO2 24 mEq/L      Calcium 10.0 mg/dL      Protein, Total 7.8 g/dL      Albumin 4.6 g/dL      AST (SGOT) 28 U/L      ALT 15 U/L      Alkaline Phosphatase 61 U/L      Bilirubin, Total 0.6 mg/dL      Globulin 3.2 g/dL      Albumin/Globulin Ratio 1.4    Magnesium [[id number]] Collected:  12/29/18 2019    Specimen:  Blood Updated:  12/29/18 2114     Magnesium 1.8 mg/dL     Phosphorus [[id number]] Collected:  12/29/18 2019    Specimen:  Blood Updated:  12/29/18 2114     Phosphorus 4.2 mg/dL     Urine Rapid Drug Screen [[id number]]  (Abnormal) Collected:  12/29/18 2019    Specimen:  Urine Updated:  12/29/18 2105     Amphetamine Screen, UR Negative     Barbiturate Screen, UR Negative     Benzodiazepine Screen, UR Negative     Cannabinoid Screen, UR Positive     Cocaine, UR Negative     Opiate Screen, UR Negative  PCP Screen, UR Negative    CBC and differential [[id number]]  (Abnormal) Collected:  12/29/18 2019    Specimen:  Blood Updated:  12/29/18 2040     WBC 8.72 x10 3/uL      Hgb 13.5 g/dL      Hematocrit 40.9 %      Platelets 353 x10 3/uL      RBC 4.47 x10 6/uL      MCV 90.8 fL      MCH 30.2 pg      MCHC 33.3 g/dL      RDW 13 %      MPV 8.4 fL      Neutrophils 57.7 %      Lymphocytes Automated 36.4 %      Monocytes 4.9 %      Eosinophils Automated 0.5 %      Basophils Automated 0.2 %      Immature Granulocyte 0.3 %      Nucleated RBC 0.0 /100 WBC      Neutrophils Absolute 5.03 x10 3/uL      Abs Lymph Automated 3.17 x10 3/uL      Abs Mono Automated 0.43 x10 3/uL      Abs Eos Automated 0.04 x10 3/uL      Absolute Baso Automated 0.02 x10 3/uL      Absolute Immature Granulocyte 0.03 x10 3/uL      Absolute NRBC 0.00 x10 3/uL         Signed by: Evorn Gong, FNP-BC

## 2018-12-31 MED ORDER — QUETIAPINE FUMARATE ER 150 MG PO TB24
150.00 mg | ORAL_TABLET | Freq: Every evening | ORAL | Status: DC
Start: 2018-12-31 — End: 2019-01-04
  Administered 2018-12-31 – 2019-01-03 (×4): 150 mg via ORAL
  Filled 2018-12-31 (×5): qty 1

## 2018-12-31 NOTE — Discharge Instr - AVS First Page (Addendum)
Post Hospital Continuing Care Plan, including all 11 elements of the After Visit Summary (AVS) were not faxed as RTC program does not require clinical information on 01/04/19.    RECOMMENDED THAT YOU COMPLY WITH THE FOLLOWING PLAN: IF YOU ARE INTERESTED IN SUBSTANCE USE RESIDENTIAL SERVICES, PLEASE CONTACT:  FOLLOW UP APPOINTMENT WITH: [facility]               Date: 3/4                       Time: TBD  LOCATION: [address] [place] [address]  P: [phone]  F:    FOR MENTAL HEALTH SERVICES, PLEASE CONTACT:  [facility]   [address], [place] [address]   P: [phone]  F:    FOR EMERGENCY MENTAL HEALTH, CONTACT: ________    National Suicide Prevention Lifeline :  [phone]      The following were reviewed with patient/family by _______________________RN.    1. Reason for IP admission:   Diagnosis: _________________________________     Precipitating event:_______________________________________________    2. Major procedures and tests, including summary of results  3. Diagnosis at discharge  4. Current medication list  5. Were studies pending at discharge?           No      Yes                                                                                                                                                 If yes, you may call for results at: _______________________(unit phone #)    6. Patient instructions;  Adult Wellness, Recovery, and Safety Plan  7. Emergency contact information  8. Plan for follow up care   9. Name of provider(s), appointment(s), and location(s) of follow up care.                                              Advance Care Plan  10. Patient had a medical and mental health Advance Directive at admission.                                                                            No      Yes    11. If patient did not have a medical and mental health Advance Directive at admission:  information about completing Advance Directives or  designating a surrogate decision maker, and a form, was provided.  After receiving information- Did patient create a medical and mental health Advance Directive or appoint a surrogate decision maker?                                                                                                       No      Yes  If No:   12. If no, what is the reason?   ( Check reason)                 _____  Prefer to wait until I feel better        _____  Prefer to discuss with family or significant other        _____  Prefer to discuss with continuing care provider        _____  Do not believe I will need a mental health Advance Directive        _____  Other_____________________________________________

## 2018-12-31 NOTE — Progress Notes (Signed)
Pt in bed with eyes closed and appears to be sleeping. No distress or discomfort noted during rounds. Continue to monitor for safety.

## 2018-12-31 NOTE — Progress Notes (Signed)
Psychiatry Progress Note  (Level 1 = Problem Focused, Level 2 = Expanded Problem Focused, Level 3 = Detailed)    Patient Name: Ricky Long.            Current Date/Time:  2/21/20207:54 PM  MRN:  [id number]                            Attending Physician: Glynis Smiles, MD  DOB: 1991/10/15                                Gender: male                              I. History   Informants: Patient, Chart review, Staff  A. Chief Complaint or Reason for Admission       "I feel drugged up today"  B.History of Present Illness:      (Symptoms and qualifiers:1 for Level 1, 2 for Level 2 and 3+ for Level 3)    Patient is a 28 y.o. 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    Interval History  Patient was found lying in bed around 11 AM sleeping. He reports that "he feels drugged up" by his medications, and as such was contemplating about refusing all other scheduled Quetiapine's. I offered to change from immediate release to extended release to minimize sedation and he accepted it.    He denies depression or anxiety symptoms. He denies self-injurious thoughts, suicidal or homicidal ideation. However, he verbalizes some passive suicidal ideation; patient reports that his suicidality will depend on prevailing psychosocial stressors post discharge or rehab.Marland Kitchen He denies paranoia, auditory or visual hallucinations. His speech remains increased but some words are not comprehensible. He denies racing thoughts or flight of ideas.    No safety or behavioral issues reported from overnight. He remains isolative to his room. He utilized prn vistaril for anxiety. He attended one group yesterday.    C.Medical History  Review of Systems  (Level  1 is none, Level 2 is 1, and Level 3 is 2+)  Psychiatric: No complaints  Constitutional: Fatigue and sleepy  Cardiovascular: No complaints  Respiratory: No complaints  Neurological: Drowsy    D.Additional Past Psychiatric, Substance Use, Medical, Family and Social History  Psychiatric: No new information available as compared to previous encounter(s)  Substance Use: No new information available as compared to previous encounter(s)  Medical: No new information available as compared to previous encounter(s)  Family: No new information available as compared to previous encounter(s)  Social: No new information available as compared to previous encounter(s)    Medications:   Current Medications    Scheduled     Medication Dose/Rate, Route, Frequency Last Action    cetirizine (ZyrTEC) tablet 10 mg 10 mg, PO, Daily Given: 02/21 0828    fluticasone (FLONASE) 50 MCG/ACT nasal spray 1 spray 1 spray, EACH NARE, Daily Given: 02/21 0828    QUEtiapine (SEROquel-XR) tablet 24 hr 150 mg 150 mg, PO, QHS Ordered         PRN  Medication Dose/Rate, Route, Frequency Last Action    acetaminophen (TYLENOL) tablet 650 mg 650 mg, PO, Q6H PRN Ordered    albuterol (PROVENTIL,VENTOLIN) nebulizer solution 1.25 mg 1.25 mg, NEBULIZATION, Q6H PRN Ordered    alum & mag hydroxide-simethicone (MAALOX PLUS) 200-200-20 mg/5 mL suspension 30 mL 30 mL, PO, Q6H PRN Ordered    docusate sodium (COLACE) capsule 100 mg 100 mg, PO, BID PRN Ordered    hydrOXYzine (VISTARIL) capsule 50 mg 50 mg, PO, Q4H PRN Given: 02/21 1948    ondansetron (ZOFRAN) tablet 4 mg 4 mg, PO, Q8H PRN Ordered    traZODone (DESYREL) tablet 50 mg 50 mg, PO, QHS PRN Ordered                II. Examination   Vital signs reviewed:   Blood pressure 117/75, pulse 85, temperature 98.6 F (37 C), temperature source Oral, resp. rate 14, height 1.575 m (5\' 2" ), weight 56.7 kg (125 lb), SpO2 97 %.     Mental Status Exam  (Level 1 is 1-5, Level 2 is 6-8, Level 3 is 9+)  General appearance:  Appears chronological age, Poor hygiene and Poor grooming  Attitude/Behavior: Calm, Cooperative and Eye Contact is  Good  Motor: No abnormalities noted  Gait: No obvious abnormalities  Muscle strength and tone: Not tested  Speech:   Spontaneous: Yes  Rate and Rhythm: Increased  Volume: Normal  Tone: Normal  Clarity: Yes  Mood: "Good"  Affect:   Range: Constricted  Stability: Stable  Appropriateness to thought content: Yes  Intensity: Intense  Thought Process:   Coherent: Yes  Logical:  Yes  Associations: Goal-directed  Thought Content:   Delusions:  Not elicited  Depressive Cognitions:  Hopeless  Suicidal:  Suicidal thoughts  Homicidal:  No homicidal thoughts  Violent Thoughts:  No  Perceptions:   Hallucinations: No  Insight: poor to fair  Judgment: Poor to fair   Level of Consciousness: Intact  Orientation: Intact to self, place and time  Recent Memory: Intact    Psychiatric / Cognitive Instruments: None    Pertinent Physical Exam: Not relevant to current chief complaint/reason for admission    Imaging / EKG / Labs:   Labs in the last 24 hours  Results     ** No results found for the last 24 hours. **        EKG Results   Cardiology Results     Procedure Component Value Units Date/Time    ECG 12 Lead [[id number]] Collected:  12/29/18 1925     Updated:  12/30/18 1238     Ventricular Rate 93 BPM      Atrial Rate 93 BPM      P-R Interval 124 ms      QRS Duration 82 ms      Q-T Interval 348 ms      QTC Calculation (Bezet) 432 ms      P Axis 62 degrees      R Axis 61 degrees      T Axis 39 degrees     Narrative:       NORMAL SINUS RHYTHM  NORMAL ECG  NO PREVIOUS ECGS AVAILABLE  Confirmed by Neomia Dear MD, JOHN ([id number]) on 12/30/2018 12:38:38 PM            III. Assessment and Plan (Medical Decision Making)     1. I certify that this patient continues to require inpatient hospitalization due to acute risk to self and unable to care for self  in the community with insufficient support available    2. Psychiatric Diagnoses   Axis I     Unspecified Depressive Disorder   Cannabis Use Disorder, Severe and Stimulant Use Disorder, Severe   Adjustment Disorder with Mixed Anxiety and Depressed Mood   RO substance induced Mood DO    Axis II    Deferred   Axis III  Past Medical History:   Diagnosis Date   . Calculus of kidney    . Crohn disease    . Hypertension      Past Surgical History:   Procedure Laterality Date   . APPENDECTOMY     . CHOLECYSTECTOMY     . LAPAROSCOPIC, APPENDECTOMY N/A 07/05/2015    Procedure: LAPAROSCOPIC, APPENDECTOMY;  Surgeon: Rosaria Ferries, MD;  Location: [facility];  Service: General;  Laterality: N/A;   . THYROIDECTOMY        Axis IV   Problems with primary support group   Problems related to the social environment   Other psychosocial and environmental problems   Axis V   Current GAF: 51-60: moderate symptoms.    3.All diagnostic procedures completed since admission were reviewed. Past medical records reviewed. Coordination of care was discussed with inpatient team and as available with the outpatient team.    4. On this admission patient educated about and provided input into their treatment plan.  Patient understands potential risks and benefits of proposed treatment plan.     5.  Assessment / Impression  Patient is a 28 y.o. Caucasian male , single, unemployed with past history of methamphetamine abuse (has been sober for the last 4 years) presented to [facility] after he relapsed on methamphetamines 3 days ago secondary to multiple psychosocial stressors (Job loss, Separation from son, Homelessness, ill father), worsening depression and SI with no plan.    Patient reported increased sedation from quetiapine today and as such was contemplating on refusing all other scheduled doses. He agrees to try the extended release to see if that may help with the sedation. He was on 100 mg HS but considering his speech rate, will increase from 100 mg to 150 mg XR. Last EKG was NSR QTC 432, will repeat EKG on 2/23.   He  continues express interest substance abuse rehab; I reiterated the need for him to attend groups to boost his acceptance rate as compared to him being isolative and he accepted it.  Continue to monitor patient for changes and encourage group attendance.    Suicide Risk Assessment   Suicide Risk Assessment performed? Yes: Suicide Thoughts / Behaviors: None  Current Plan: None    Plan / Recommendations:    Biological Plan:  Medications:   Continue     Zytec 10mg  tablet daily     Flonase 1 spray daily  Change Quetiapine from  100mg  immediate release to XR 150 mg starting tonight   Medical Work-up: EKG on 2/23  Consults: None required at this time    Psychosocial Plan:  Individual therapy: Psycho-education and psychotherapy of the following modalities will continue to be provided during daily visits:Supportive  Group and milieu therapies: daily per unit's schedule.  Continue with Social Work intervention provided for discharge planning including assistance with placement, case management referral and follow-up psychiatric care.    Plan for Family Involvement: Update as needed  Other Providers Contact Information and Dates Contacted: No Updates    Total Attending time spent 25 minutes (floor time) with more than 50 percent of  time in direct patient contact, coordinating care and counseling.    Signed by: Ruffin Verenice Westrich, DNP NP  12/31/2018  7:54 PM

## 2018-12-31 NOTE — Progress Notes (Signed)
Daily Note:    Affect/Mood:  Appropriate    Thought Process:  Logical    Thought Content:  Within Normal Limits    Interpersonal:  Attentive    Treatment Plan Goal Addressed:   Identifies stressors, protective factors, and coping strategies  Complete safety plan   Intervention Used and Patient Response:   Assist patient to identify stressors, protective factors, and develop healthy coping strategies for self-destructive feelings  Therapist completed safety plan with pt . Pt was able to provided positive coping skills to utilize while here in the hospital. Pt appeared to have had a bizarre affect mentioned his relapse to meth along with other stressors that let him to relapse.Therapist provided support and encouraged pt to come to groups to talk about theses stressors.     Patient Participation in Groups:  Ricky Long did not attend any groups today. Will continue to encourage group attendance and participation in order to increase positive coping skills and express feelings regarding current situation.    Safety Plan: (completed/attempted)   completed 12/31/2018

## 2018-12-31 NOTE — Plan of Care (Signed)
Problem: At Risk for Harm to Self: AS EVIDENCED BY...  Goal: Attends a minimum number of therapies daily  Outcome: Progressing  More visible today, attending groups.   Problem: At Risk for Harm to Self: AS EVIDENCED BY...  Goal: Patient's recovery goal in his/her own words:  Outcome: Progressing   Patient described his mood "okay". He denied SI/HI/AVH. He rated his anxiety 4/10. He feels safe on the unit. Reported good sleep last night.  Med compliant.
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
## 2019-01-01 DIAGNOSIS — F1220 Cannabis dependence, uncomplicated: Secondary | ICD-10-CM

## 2019-01-01 DIAGNOSIS — F1520 Other stimulant dependence, uncomplicated: Secondary | ICD-10-CM

## 2019-01-01 DIAGNOSIS — F329 Major depressive disorder, single episode, unspecified: Secondary | ICD-10-CM

## 2019-01-01 NOTE — Plan of Care (Signed)
Problem: At Risk for Harm to Self: AS EVIDENCED BY...  Goal: Identifies stressors, protective factors, and coping strategies  Outcome: Progressing  Flowsheets (Taken 01/01/2019 1621)  Identifies stressors, protective factors and coping strategies: Assist patient to identify stressors, protective factors, and develop healthy coping strategies for self-destructive feelings; Assist patient to identify trigger(s) for self-harm behavior; Reinforce patient's use/efforts to use healthy coping strategies during hospitalization     Ricky Long is alert and oriented x4. He denied SI, HI, A/VH. He was mostly isolative to his room, but came to nurse's station to make needs known. He requested Vistaril for anxiety, which he appeared restless. PRN Vistaril 50mg  administered with good effect. States his mood is okay and sleep is okay. Denied pain. Will continue to monitor patient for safety.

## 2019-01-01 NOTE — Plan of Care (Signed)
Problem: At Risk for Harm to Self: AS EVIDENCED BY...  Goal: Reduction of self-harm thoughts and no attempts  Outcome: Progressing     Pt going back and forth to the nurses station and his room. He is able to make his needs known. He requested Visteril for anxiety. He was given 50mg  PO. He took his meds, accepted snacks. Later he asked and received Trazodone for sleep. He admits to being depressed and anxious.

## 2019-01-01 NOTE — Progress Notes (Signed)
Daily Note:    Affect/Mood:  Unable to assess    Thought Process:  Unable to assess    Thought Content:  Unable to assess    Interpersonal:  Unable to assess    Treatment Plan Goal Addressed:   Patient will attend a minimum of two groups daily by day six.    Patient Participation in Groups:  Patient did not attend any of the four groups today.  Will continue to monitor, assess and encourage group attendance.    Safety Plan: (completed/attempted)   completed 12/31/2018

## 2019-01-01 NOTE — Plan of Care (Signed)
Problem: At Risk for Harm to Self: AS EVIDENCED BY...  Goal: Patient's recovery goal in his/her own words:  Outcome: Progressing  Goal: Reduction of self-harm thoughts and no attempts  Outcome: Progressing  Goal: Identifies stressors, protective factors, and coping strategies  Outcome: Progressing  Goal: Verbalizes understanding of medication, benefits, and side effects  Outcome: Progressing  Goal: Attends a minimum number of therapies daily  Outcome: Progressing  Goal: Completes discharge safety and recovery plan  Outcome: Progressing     Problem: Safety  Goal: Patient will be free from injury during hospitalization  Outcome: Progressing  Goal: Patient will be free from infection during hospitalization  Outcome: Progressing     Problem: Pain  Goal: Pain at adequate level as identified by patient  Outcome: Progressing     Problem: Side Effects from Pain Analgesia  Goal: Patient will experience minimal side effects of analgesic therapy  Outcome: Progressing     Problem: Discharge Barriers  Goal: Patient will be discharged home or other facility with appropriate resources  Outcome: Progressing     Problem: Psychosocial and Spiritual Needs  Goal: Demonstrates ability to cope with hospitalization/illness  Outcome: Progressing   Pt isolative to his room reading a book. He was receptive to approach.  Ricky Long says, "the meds are making me sleep. I slept all day and I probably won't be able to sleep tonight. I told the Doctor but the Doctor said give it some time". Pt was given reassurance. He decided to take a sleep aid. Pt given Trazodone 50mg  PO. Will continue to monitor for effectiveness.

## 2019-01-01 NOTE — Progress Notes (Signed)
Psychiatry Progress Note  (Level 1 = Problem Focused, Level 2 = Expanded Problem Focused, Level 3 = Detailed)    Patient Name: Ricky Long.            Current Date/Time:  2/22/20203:00 PM  MRN:  [id number]                            Attending Physician: Glynis Smiles, MD  DOB: 03/03/1991                                Gender: male                              I. History   Informants: the pt the chart and the team   A. Chief Complaint or Reason for Admission       I am better  B.History of Present Illness: Interval History     (Symptoms and qualifiers:1 for Level 1, 2 for Level 2 and 3+ for Level 3)    Patient is a 28 y.o. Caucasian male presenting with cont ambivalence about safety outside the hsopital.He is more organized and sleeping better. Eating well. He spoke with his father last night and he clarified that he can return there as his father would have to pay extra rent and he does not have it. He feels sl sedated from the seroquel and this may actually be the reorganization of his thoughts. His mood is down. He feels less helpless but is still sl hopeless. He will be looking at [facility] [employer] possibly on [place].     C.Medical History  Review of Systems  (Level 1 is none, Level 2 is 1, and Level 3 is 2+)  Psychiatric: Depression and Anxiety  Constitutional: Fatigue    D.Additional Past Psychiatric, Substance Use, Medical, Family and Social History  Psychiatric: No new information available as compared to previous encounter(s)  Substance Use: No new information available as compared to previous encounter(s)  Medical: No new information available as compared to previous encounter(s)  Family: No new information available as compared to previous encounter(s)  Social: No new information available as compared to previous encounter(s)    Medications:   Current Medications    Scheduled     Medication Dose/Rate, Route, Frequency Last Action    cetirizine (ZyrTEC) tablet 10 mg 10 mg, PO, Daily Given: 02/22  0834    fluticasone (FLONASE) 50 MCG/ACT nasal spray 1 spray 1 spray, EACH NARE, Daily Given: 02/22 0834    QUEtiapine (SEROquel-XR) tablet 24 hr 150 mg 150 mg, PO, QHS Given: 02/21 2111         PRN     Medication Dose/Rate, Route, Frequency Last Action    acetaminophen (TYLENOL) tablet 650 mg 650 mg, PO, Q6H PRN Ordered    albuterol (PROVENTIL,VENTOLIN) nebulizer solution 1.25 mg 1.25 mg, NEBULIZATION, Q6H PRN Ordered    alum & mag hydroxide-simethicone (MAALOX PLUS) 200-200-20 mg/5 mL suspension 30 mL 30 mL, PO, Q6H PRN Ordered    docusate sodium (COLACE) capsule 100 mg 100 mg, PO, BID PRN Ordered    hydrOXYzine (VISTARIL) capsule 50 mg 50 mg, PO, Q4H PRN Given: 02/22 1153    ondansetron (ZOFRAN) tablet 4 mg 4 mg, PO, Q8H PRN Ordered    traZODone (DESYREL) tablet 50 mg 50 mg, PO, QHS  PRN Given: 02/21 2253                II. Examination   Vital signs reviewed:   Blood pressure 97/63, pulse 89, temperature 97.9 F (36.6 C), temperature source Oral, resp. rate 14, height 1.575 m (5\' 2" ), weight 56.7 kg (125 lb), SpO2 96 %.     Mental Status Exam  (Level 1 is 1-5, Level 2 is 6-8, Level 3 is 9+)  General appearance: Appears chronological age, Good hygiene and Good grooming  Attitude/Behavior: Calm and Cooperative  Motor: No abnormalities noted  Gait: No obvious abnormalities  Muscle strength and tone: Grossly intact  Speech:   Spontaneous: Yes  Rate and Rhythm: Normal  Volume: Normal  Tone: Normal  Clarity: Yes  Mood: better   Affect:   Range: Constricted   Mood congruent   Thought Process:   Coherent: Yes  Logical:  Yes  Associations: Circumstantial  Thought Content:   Delusions:  Not elicited  Depressive Cognitions:  Hopeless and Guilt  Suicidal:  Suicidal thoughts  Homicidal:  No homicidal thoughts  Violent Thoughts:  No  Perceptions:   Dissociative Phenomena:  No  Hallucinations: No  Insight: Adequate  Judgment: Fair  Cognition:   Level of Consciousness: Intact    Psychiatric / Cognitive Instruments:  None    Pertinent Physical Exam: Not relevant to current chief complaint/reason for admission    Imaging / EKG / Labs:   Labs in the last 24 hours  Results     ** No results found for the last 24 hours. **        EKG Results   Cardiology Results     Procedure Component Value Units Date/Time    ECG 12 Lead [[id number]] Collected:  12/29/18 1925     Updated:  12/30/18 1238     Ventricular Rate 93 BPM      Atrial Rate 93 BPM      P-R Interval 124 ms      QRS Duration 82 ms      Q-T Interval 348 ms      QTC Calculation (Bezet) 432 ms      P Axis 62 degrees      R Axis 61 degrees      T Axis 39 degrees     Narrative:       NORMAL SINUS RHYTHM  NORMAL ECG  NO PREVIOUS ECGS AVAILABLE  Confirmed by Neomia Dear MD, JOHN ([id number]) on 12/30/2018 12:38:38 PM            III. Assessment and Plan (Medical Decision Making)     1. I certify that this patient continues to require inpatient hospitalization due to acute risk to self    2. 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 Calculus of kidney    . Crohn disease    . Hypertension      Past Surgical History:   Procedure Laterality Date   . APPENDECTOMY     . CHOLECYSTECTOMY     . LAPAROSCOPIC, APPENDECTOMY N/A 07/05/2015    Procedure: LAPAROSCOPIC, APPENDECTOMY;  Surgeon: Rosaria Ferries, MD;  Location: [facility];  Service: General;  Laterality: N/A;   . THYROIDECTOMY        Axis IV   Problems with primary support group   Problems related to the social environment   Other psychosocial and environmental problems   Axis V   Current GAF: 41-50: serious symptoms.    3.All diagnostic procedures completed since admission were reviewed. Past medical records reviewed. Coordination of care was discussed with inpatient team and  as available with the outpatient team.    4. On this admission patient educated about and provided input into their treatment plan.  Patient understands potential risks and benefits of proposed treatment plan.     5.  Assessment / Impression  Patient is a 28 y.o. Caucasian male with a history of suicidal thinking in the context of losing his job and housing. This lead to a relapse of methamphetamine use after 4 years of only using cannabis. He is tolerating the seroquel well and may have some sedation from this vx withdrawal from stimulants.     Suicide Risk Assessment   Suicide Risk Assessment performed? Yes: Suicide Thoughts / Behaviors: None  Current Plan: None  Access to firearm: No    Plan / Recommendations:    Biological Plan:  Medications: no change monitor for sedation   Medical Work-up: None required at this time  Consults: None required at this time    Psychosocial Plan:  Individual therapy: Psycho-education and psychotherapy of the following modalities will continue to be provided during daily visits:Supportive  Group and milieu therapies: daily per unit's schedule.  Continue with Social Work intervention provided for discharge planning including assistance with follow-up psychiatric care.    Plan for Family Involvement: father is supportive   Other Providers Contact Information and Dates Contacted: No Updates    Total Attending time spent 25 minutes (floor time) with more than 50 percent of time in direct patient contact, coordinating care and counseling.    Signed by: Rayvon Char, NP  01/01/2019  3:00 PM

## 2019-01-01 NOTE — Progress Notes (Signed)
Uneventful shift. Pt slept about 7 hours. Safety maintained.

## 2019-01-02 MED ORDER — LORAZEPAM 1 MG PO TABS
1.00 mg | ORAL_TABLET | Freq: Once | ORAL | Status: AC
Start: 2019-01-02 — End: 2019-01-02
  Administered 2019-01-02: 17:00:00 1 mg via ORAL
  Filled 2019-01-02: qty 1

## 2019-01-02 NOTE — Plan of Care (Addendum)
Patient has been  isolative to his room resting in bed. Ricky Long is calm and AXOX4. Patient presents as cooperative with all care including medication administration. Patient reports having a normal appetite and sleeping well. Ricky Long would like to discuss treatment options with his doctor today. Patient requested medication for anxiety, PRN Vistaril 50 mg given at 1530 with good effect. Patient walked out of group therapy meeting at 1615 and stated, " I feel like I am having a panic attack." vital signs taken B/P 127/70, P-90 o2 sat 100%. NP Heywood Iles contacted and a 1 time order for Ativan 1 mg PO  was obtained and given at 1630. Will continue to monitor for patient safety

## 2019-01-02 NOTE — Plan of Care (Signed)
Problem: At Risk for Harm to Self: AS EVIDENCED BY...  Goal: Patient's recovery goal in his/her own words:  Outcome: Progressing  Goal: Reduction of self-harm thoughts and no attempts  Outcome: Progressing  Goal: Identifies stressors, protective factors, and coping strategies  Outcome: Progressing  Goal: Verbalizes understanding of medication, benefits, and side effects  Outcome: Progressing  Goal: Attends a minimum number of therapies daily  Outcome: Progressing  Goal: Completes discharge safety and recovery plan  Outcome: Progressing     Problem: Safety  Goal: Patient will be free from injury during hospitalization  Outcome: Progressing  Goal: Patient will be free from infection during hospitalization  Outcome: Progressing     Problem: Pain  Goal: Pain at adequate level as identified by patient  Outcome: Progressing     Problem: Side Effects from Pain Analgesia  Goal: Patient will experience minimal side effects of analgesic therapy  Outcome: Progressing     Problem: Discharge Barriers  Goal: Patient will be discharged home or other facility with appropriate resources  Outcome: Progressing     Problem: Psychosocial and Spiritual Needs  Goal: Demonstrates ability to cope with hospitalization/illness  Outcome: Progressing  Pt was in his room reading. He was receptive to approach. He prefers to read in the evening rather than mingle with his peers. Pt reports he attended group but felt anxious and left.  Presently he appears calm.  He reports that he is beginning to adjust to the Seroquel that is scheduled qhs.  He is med compliant. He was given Trazodone for sleep.

## 2019-01-02 NOTE — Progress Notes (Signed)
Psychiatry Progress Note  (Level 1 = Problem Focused, Level 2 = Expanded Problem Focused, Level 3 = Detailed)    Patient Name: Ricky Long.            Current Date/Time:  2/23/202012:27 PM  MRN:  [id number]                            Attending Physician: Glynis Smiles, MD  DOB: [birth year]                                Gender: male                              I. History   Informants: the pt the team and the chart  A. Chief Complaint or Reason for Admission       I feel OK   B.History of Present Illness: Interval History     (Symptoms and qualifiers:1 for Level 1, 2 for Level 2 and 3+ for Level 3)    Patient is a 28 y.o. Caucasian male presenting with a goal to focus on geting well and tx so that he can take care of his sone and father eventually. He cont to deny si hi while in house but ambivlaent about his hope for recovery. He is less sedated today . He remains islaltive to his room and did not attend any groups yesterday. He is  still with sl rapid speech.he is eating well. He used 50 trazodone for sleep last night. He reports his mood is a little better. Still with poor focus and can not concentrate on reading his book     C.Medical History  Review of Systems  (Level 1 is none, Level 2 is 1, and Level 3 is 2+)  Psychiatric: Depression and Anxiety  Constitutional: Fatigue    D.Additional Past Psychiatric, Substance Use, Medical, Family and Social History  Psychiatric: No new information available as compared to previous encounter(s)  Substance Use: No new information available as compared to previous encounter(s)  Medical: No new information available as compared to previous encounter(s)  Family: No new information available as compared to previous encounter(s)  Social: No new information available as compared to previous encounter(s)    Medications:   Current Medications    Scheduled     Medication Dose/Rate, Route, Frequency Last Action    cetirizine (ZyrTEC) tablet 10 mg 10 mg, PO, Daily  Given: 02/23 0842    fluticasone (FLONASE) 50 MCG/ACT nasal spray 1 spray 1 spray, EACH NARE, Daily Given: 02/23 0842    QUEtiapine (SEROquel-XR) tablet 24 hr 150 mg 150 mg, PO, QHS Given: 02/22 2119         PRN     Medication Dose/Rate, Route, Frequency Last Action    acetaminophen (TYLENOL) tablet 650 mg 650 mg, PO, Q6H PRN Ordered    albuterol (PROVENTIL,VENTOLIN) nebulizer solution 1.25 mg 1.25 mg, NEBULIZATION, Q6H PRN Ordered    alum & mag hydroxide-simethicone (MAALOX PLUS) 200-200-20 mg/5 mL suspension 30 mL 30 mL, PO, Q6H PRN Ordered    docusate sodium (COLACE) capsule 100 mg 100 mg, PO, BID PRN Ordered    hydrOXYzine (VISTARIL) capsule 50 mg 50 mg, PO, Q4H PRN Given: 02/22 1920    ondansetron (ZOFRAN) tablet 4 mg 4 mg, PO, Q8H PRN Ordered  traZODone (DESYREL) tablet 50 mg 50 mg, PO, QHS PRN Given: 02/22 2119                II. Examination   Vital signs reviewed:   Blood pressure 101/73, pulse (!) 117, temperature 97.9 F (36.6 C), temperature source Oral, resp. rate 16, height 1.575 m (5\' 2" ), weight 56.7 kg (125 lb), SpO2 97 %.     Mental Status Exam  (Level 1 is 1-5, Level 2 is 6-8, Level 3 is 9+)  General appearance: Appears chronological age, Poor hygiene and Poor grooming  Attitude/Behavior: Eye Contact is  Intense restless   Motor: No abnormalities noted  Gait: No obvious abnormalities  Muscle strength and tone: Grossly intact  Speech:   Spontaneous: Yes  Rate and Rhythm: Increased  Volume: Normal  Tone: Normal  Clarity: at times slurred   Mood: better   Affect:   Range: Constricted  Stability: Stable  Appropriateness to thought content: No  Intensity: Intense  Thought Process:   Coherent: Yes  Logical:  Yes  Associations: Goal-directed and Circumstantial  Thought Content: si if dcd    Delusions:  Not elicited  Depressive Cognitions:  Guilt  Suicidal:  Suicidal thoughts  Homicidal:  No homicidal thoughts  Violent Thoughts:  No  Perceptions:   Dissociative Phenomena:  No  Hallucinations:  No  Insight: Fair  Judgment: Fair  Cognition:   Level of Consciousness: Intact  Orientation: Intact to self, place and time  Recent Memory: Intact  Remote Memory: Intact  Attention and Concentration: Impaired  Fund of Knowledge: Good    Psychiatric / Cognitive Instruments: None    Pertinent Physical Exam: Not relevant to current chief complaint/reason for admission    Imaging / EKG / Labs:   Labs in the last 24 hours  Results     ** No results found for the last 24 hours. **        EKG Results   Cardiology Results     Procedure Component Value Units Date/Time    ECG 12 Lead [[id number]] Collected:  12/29/18 1925     Updated:  12/30/18 1238     Ventricular Rate 93 BPM      Atrial Rate 93 BPM      P-R Interval 124 ms      QRS Duration 82 ms      Q-T Interval 348 ms      QTC Calculation (Bezet) 432 ms      P Axis 62 degrees      R Axis 61 degrees      T Axis 39 degrees     Narrative:       NORMAL SINUS RHYTHM  NORMAL ECG  NO PREVIOUS ECGS AVAILABLE  Confirmed by Neomia Dear MD, JOHN ([id number]) on 12/30/2018 12:38:38 PM            III. Assessment and Plan (Medical Decision Making)     1. I certify that this patient continues to require inpatient hospitalization due to acute risk to self    2. Psychiatric Diagnoses   Axis I    Unspecified Depressive Disorder   Cannabis Use Disorder, Severe and Stimulant Use Disorder, Severe   Adjustment Disorder with Mixed Anxiety and Depressed Mood   RO substance induced mood do    Axis II    Deferred   Axis III  Past Medical History:   Diagnosis Date   . Calculus of kidney    . Crohn disease    .  Hypertension      Past Surgical History:   Procedure Laterality Date   . APPENDECTOMY     . CHOLECYSTECTOMY     . LAPAROSCOPIC, APPENDECTOMY N/A 07/05/2015    Procedure: LAPAROSCOPIC, APPENDECTOMY;  Surgeon: Rosaria Ferries, MD;  Location: [facility];  Service: General;  Laterality: N/A;   . THYROIDECTOMY        Axis IV   Problems with primary support group   Problems related to the social  environment   Other psychosocial and environmental problems   Axis V   Current GAF: 41-50: serious symptoms.    3.All diagnostic procedures completed since admission were reviewed. Past medical records reviewed. Coordination of care was discussed with inpatient team and as available with the outpatient team.    4. On this admission patient educated about and provided input into their treatment plan.  Patient understands potential risks and benefits of proposed treatment plan.     5.  Assessment / Impression  Patient is a 28 y.o. 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 HE is admitted with the above and apparent depression and adjustment reaction. Responding slowly to seroquel     Suicide Risk Assessment   Suicide Risk Assessment performed? Yes: Suicide Thoughts / Behaviors: None  Current Plan: None  Access to firearm: No    Plan / Recommendations:    Biological Plan:  Medications: monitor sedation may need to switch   Medical Work-up: None required at this time  Consults: None required at this time    Psychosocial Plan:  Individual therapy: Psycho-education and psychotherapy of the following modalities will continue to be provided during daily visits:Supportive  Group and milieu therapies: daily per unit's schedule.  Continue with Social Work intervention provided for discharge planning including assistance with follow-up psychiatric care.    Plan for Family Involvement: father supportive  Other Providers Contact Information and Dates Contacted: No Updates    Total Attending time spent 25 minutes (floor time) with more than 50 percent of time in direct patient contact, coordinating care and counseling.    Signed by: Rayvon Char, NP  01/02/2019  12:27 PM

## 2019-01-02 NOTE — Progress Notes (Signed)
Daily Note:    Affect/Mood:  Anxious    Thought Process:  Goal Directed    Thought Content:  Other   unable to fully assess    Interpersonal:  Isolative, attended part of one group    Treatment Plan Goal Addressed: Patient will attend a minimum of 2 groups daily by day 7.       Patient Participation in Groups: Patient was isolative in his room in bed most of day. He did come to part of the last group of the day "Wellness". Patient appeared attentive. He participated a little bit. His speech seemed  a little rapid and slurred.   He was pleasant and cooperative. Will continue to monitor, assess and encourage group attendance.      Safety Plan: (completed/attempted) Completed 12/31/2018

## 2019-01-03 MED ORDER — LORAZEPAM 1 MG PO TABS
1.00 mg | ORAL_TABLET | Freq: Once | ORAL | Status: AC
Start: 2019-01-03 — End: 2019-01-03
  Administered 2019-01-03: 14:00:00 1 mg via ORAL
  Filled 2019-01-03: qty 1

## 2019-01-03 MED ORDER — TRAZODONE HCL 50 MG PO TABS
25.0000 mg | ORAL_TABLET | Freq: Every evening | ORAL | Status: DC | PRN
Start: 2019-01-03 — End: 2019-01-04
  Administered 2019-01-03: 23:00:00 25 mg via ORAL
  Filled 2019-01-03: qty 1

## 2019-01-03 NOTE — Progress Notes (Signed)
Psychiatry Progress Note  (Level 1 = Problem Focused, Level 2 = Expanded Problem Focused, Level 3 = Detailed)    Patient Name: Ricky Long.            Current Date/Time:  2/24/20203:05 PM  MRN:  [id number]                            Attending Physician: Erin Sons, MD  DOB: 1991-02-12                                Gender: male                              I. History   Informants: Patient, Chart review, Staff  A. Chief Complaint or Reason for Admission       "My medications are making me too sleepy"  B.History of Present Illness:      (Symptoms and qualifiers:1 for Level 1, 2 for Level 2 and 3+ for Level 3)    Patient is a 28 y.o. 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     Interval History  Patient found lying in bed this morning. He reports that he couldn't sleep last night and had to request for trazodone. This morning, he reports that the Quetiapine ER 150 mg is making him too sleepy and is getting frustrated with that. I validated patient's concerns and explained to him that such a conclusion cannot be drawn in isolation considering that he has been taking Trazodone 50 mg for the last couple of nights.      He agreed with the plan to decrease Trazodone dose from 50 mg to 25 mg tonight to see if that may help with the next day drowsiness he is experiencing. Will discontinue trazodone if continues to experience sedation on 25 mg.  He described his mood as "OK". He denied depression or anxiety. He denied self-injurious thoughts, suicidal or homicidal ideation. He denies paranoia or AVH. He is hopeful for the future and is interested in residential substance abuse treatment.    Yesterday evening, he experienced a  panic attack and had to be medicated with lorazepam 1mg  with desired outcomes. He had no safety or behavioral issues overnight.    Later this afternoon, he was noted to be very anxious, restless, pacing around in his room and in the hallways. He was inquired about discharge; he couldn't offer any explanation for his decision. His restlessness was getting worse, his speech R/R was increasing nearing pressured and was labile. I offered him lorazepam 1mg  and he accepted it with desired outcomes.        C.Medical History  Review of Systems  (Level 1 is none, Level 2 is 1, and Level 3 is 2+)  Psychiatric: Anxiety  Constitutional: Fatigue  Cardiovascular: No complaints  Neurological: No complaints    D.Additional Past Psychiatric, Substance Use, Medical, Family and Social History  Psychiatric: No new information available as compared to previous encounter(s)  Substance Use: No new information available as compared to previous encounter(s)  Medical: No new information  available as compared to previous encounter(s)  Family: No new information available as compared to previous encounter(s)  Social: No new information available as compared to previous encounter(s)    Medications:   Current Medications    Scheduled     Medication Dose/Rate, Route, Frequency Last Action    cetirizine (ZyrTEC) tablet 10 mg 10 mg, PO, Daily Given: 02/24 0800    fluticasone (FLONASE) 50 MCG/ACT nasal spray 1 spray 1 spray, EACH NARE, Daily Given: 02/24 0800    QUEtiapine (SEROquel-XR) tablet 24 hr 150 mg 150 mg, PO, QHS Given: 02/23 2105         PRN     Medication Dose/Rate, Route, Frequency Last Action    acetaminophen (TYLENOL) tablet 650 mg 650 mg, PO, Q6H PRN Ordered    albuterol (PROVENTIL,VENTOLIN) nebulizer solution 1.25 mg 1.25 mg, NEBULIZATION, Q6H PRN Ordered    alum & mag hydroxide-simethicone (MAALOX PLUS) 200-200-20 mg/5 mL suspension 30 mL 30 mL, PO, Q6H PRN Ordered    docusate sodium (COLACE) capsule 100 mg 100 mg, PO, BID PRN  Ordered    hydrOXYzine (VISTARIL) capsule 50 mg 50 mg, PO, Q4H PRN Given: 02/23 1513    ondansetron (ZOFRAN) tablet 4 mg 4 mg, PO, Q8H PRN Ordered    traZODone (DESYREL) tablet 25 mg 25 mg, PO, QHS PRN Ordered                II. Examination   Vital signs reviewed:   Blood pressure (!) 87/67, pulse (!) 121, temperature 98.1 F (36.7 C), temperature source Oral, resp. rate 14, height 1.575 m (5\' 2" ), weight 56.7 kg (125 lb), SpO2 96 %.     Mental Status Exam  (Level 1 is 1-5, Level 2 is 6-8, Level 3 is 9+)  General appearance: Appears chronological age, Poor hygiene and Poor grooming  Attitude/Behavior: Irritable, Cooperative, Eye Contact is  Intense and Guarded  Motor: Psychomotor agitation  Gait: No obvious abnormalities  Muscle strength and tone: Not tested  Speech:   Spontaneous: Yes  Rate and Rhythm: Increased  Volume: Normal  Tone: Normal  Clarity: Difficult to understand at times  Mood: "Anxious"  Affect:   Range: Constricted  Stability: Labile  Appropriateness to thought content: Yes  Intensity: Intense  Thought Process:   Coherent: Yes  Logical:  Yes  Associations: Goal-directed  Thought Content:   Delusions:  Not elicited  Depressive Cognitions:  Helpless  Suicidal:  No suicidal thoughts  Homicidal:  No homicidal thoughts  Violent Thoughts:  No  Perceptions:   Hallucinations: No  Insight: Fair  Judgment: Fair  Cognition:   Level of Consciousness: Intact  Orientation: Intact to self, place and time  Recent Memory: Intact    Psychiatric / Cognitive Instruments: None    Pertinent Physical Exam: Not relevant to current chief complaint/reason for admission    Imaging / EKG / Labs:   Labs in the last 24 hours  Results     ** No results found for the last 24 hours. **        EKG Results   Cardiology Results     Procedure Component Value Units Date/Time    ECG 12 Lead [[id number]] Collected:  12/29/18 1925     Updated:  12/30/18 1238     Ventricular Rate 93 BPM      Atrial Rate 93 BPM      P-R Interval 124 ms       QRS Duration 82 ms  Q-T Interval 348 ms      QTC Calculation (Bezet) 432 ms      P Axis 62 degrees      R Axis 61 degrees      T Axis 39 degrees     Narrative:       NORMAL SINUS RHYTHM  NORMAL ECG  NO PREVIOUS ECGS AVAILABLE  Confirmed by Neomia Dear MD, JOHN ([id number]) on 12/30/2018 12:38:38 PM            III. Assessment and Plan (Medical Decision Making)     1. I certify that this patient continues to require inpatient hospitalization due to acute risk to self and unable to care for self in the community with insufficient support available    2. Psychiatric Diagnoses   Axis I    Unspecified Depressive Disorder   Cannabis Use Disorder, Severe and Stimulant Use Disorder, Severe   Adjustment Disorder with Mixed Anxiety and Depressed Mood   RO Substance Induced Mood Disorder   Axis II    Deferred   Axis III  Past Medical History:   Diagnosis Date   . Calculus of kidney    . Crohn disease    . Hypertension      Past Surgical History:   Procedure Laterality Date   . APPENDECTOMY     . CHOLECYSTECTOMY     . LAPAROSCOPIC, APPENDECTOMY N/A 07/05/2015    Procedure: LAPAROSCOPIC, APPENDECTOMY;  Surgeon: Rosaria Ferries, MD;  Location: [facility];  Service: General;  Laterality: N/A;   . THYROIDECTOMY        Axis IV   Problems with primary support group   Problems related to the social environment   Housing problems   Other psychosocial and environmental problems   Axis V   Current GAF: 41-50: serious symptoms.    3.All diagnostic procedures completed since admission were reviewed. Past medical records reviewed. Coordination of care was discussed with inpatient team and as available with the outpatient team.    4. On this admission patient educated about and provided input into their treatment plan.  Patient understands potential risks and benefits of proposed treatment plan.     5.  Assessment / Impression  Patient was very anxious this afternoon and had to be medicated with lorazepam with desired outcomes.  He  had complaints about his quetiapine medication- sedation. He is also utilizing trazodone every night for insomnia, this may be worsening the next day/daytime sedation he is experiencing. He agrees to trazodone dose reduction (50mg  to 25 mg). May switch to a different antipsychotic or discontinue quetiapine.  His blood pressure was low this AM; encouraged to increase po intake. Will check BMP to reassess electrolytes and EKG- f/u on last reading.    Suicide Risk Assessment   Suicide Risk Assessment performed? Yes: Suicide Thoughts / Behaviors: None  Current Plan: None    Plan / Recommendations:    Biological Plan:  Medications:   Continue    Zyrtec 10 mg daily    Flonase 50 mcg 1 spray each nares daily    Quetiapine 150 mg at bedtime  Decrease prn Trazodone dose from 50 mg to 25 mg.  Check vital signs three times daily  Medical Work-up: BMP, EKG  Consults: None required at this time    Psychosocial Plan:  Individual therapy: Psycho-education and psychotherapy of the following modalities will continue to be provided during daily visits:Supportive  Group and milieu therapies: daily per unit's schedule.  Continue with Social  Work intervention provided for discharge planning including assistance with case management referral and follow-up psychiatric care.    Plan for Family Involvement: Update as needed  Other Providers Contact Information and Dates Contacted: No Updates    Total Attending time spent 25 minutes (floor time) with more than 50 percent of time in direct patient contact, coordinating care and counseling.    Signed by: Ruffin Kijana Cromie, DNP NP  01/03/2019  3:05 PM

## 2019-01-03 NOTE — Treatment Plan (Signed)
Interdisciplinary Treatment Plan Update Meeting    01/03/2019  [name].    Participants:  Patient:  Ricky Long.  Attending Physician:  Erin Sons, MD    Short Term Goal: Med compliance and participating in groups  Long Term Goal: discharge patient to [facility]hab [facility]    Objective:  Review response to treatment, reassess needs/goals, update plan as indicated incorporating patient's strengths and stated needs, goals, and preferences.    1. Summary of Patient Progress on Treatment Plan Goals:  Patient participated in treatment plan round today. He described his mood " tired". Calm and cooperative. Med compliant. Patient reports having a normal appetite and sleeping well. Patient insisting to get discharged today. He was so upset and agitated when he found out he is not going home today.The NP notified. Ativan 1 mg for anxiety, one time order administered at 1440 with good effect. The plan is to continue current meds, monitor the pt and work on discharge plan with Child psychotherapist.      2. Level of Patient Involvement:  Actively engaged/contributing    3. Patient Understanding of Plan of Care:  Able to verbalize goals and interventions, Able to verbalize discharge criteria    4. Level of Agreement/Commitment to Plan of Care:  Ambivalent about plan of care          Contributor Signatures:      MD_________________________________ Date___________________    SW_________________________________Date ___________________    RN _________________________________Date____________________    Patient_______________________________Date____________________    Other________________________________Date ___________________    Other________________________________Date ___________________    (This document is signed electronically by Clinical research associate and electronic co-signer.  Other participants sign a printed copy which is scanned into the EMR)

## 2019-01-03 NOTE — Progress Notes (Signed)
Daily Note: 01/03/19    Affect/Mood:          Anxious and Sad    Thought Process:  Logical    Thought Content:   Within Normal Limits    Interpersonal:         Discussed Issues, Attentive and Limited Insight      Treatment Plan Goal Addressed:   Patient will attend 2 groups daily.     Intervention Used and Patient Response:   Therapist met with patient one on one to encourage group attendance. Patient was receptive to therapist's intervention. Patient reported that he would attend groups throughout the day. Therapist provided a platform for patient to process thoughts and feelings. Patient reported that he relapsed due to missing another one of his son's birthdays. Patient reported that he had not seen his son in 9 years. Patient was tearful and stated that he was motivated to see his son again. Therapist provided emotional support. Patient also reported that his father was sick and that was causing stress. Patient reported that he wanted to go to rehabilitation for substance abuse. Therapist provided validation and encouraging feedback. Therapist will continue to reinforce patient's attendance and small successes in group participation.     Patient Participation in Groups:  During the psycho education group patient was attentive however appeared to lack insight. Patient reported that his strengths were not related to his goals therefore, he was not able to achieve his goal. Therapist attempted to aid patient in identifying his strengths. Patient stated, "I can't answer this question right now. I don't have any strengths." Therapist aided patient in identifying his goals. Patient reported that his goal was to get into a rehabilitation center and stay sober. Patient reported that he had been sober for 4 years before recently relapsing. Therapist aided patient in identifying strengths and strategies that kept him sober for the past 4 years. Patient was not able to identify strengths or coping strategies. Peers  provided validation and positive feedback. Patient did not appear receptive to feedback from peers. However, patient was able to provide positive feedback to peers. Patient encouraged a peer to be himself with friends and family because, "the truth will come out anyway." Therapist reinforced patient's participation in group. During group therapy patient was tearful. Patient reported that he had not seen his child in 9 years. Patient also reported that he had been sober for 4 years; and that when he relapsed he, "threw it all away." Therapist provided education about recovery and explained the role relapse had in recovery. Patient appeared receptive to therapist feedback. Therapist and peers applauded patient for his sobriety. Therapist encouraged patient to identify strategies that kept him sober. Patient stated, "that's what I'm here to figure out." Therapist reinforced patient's participation in group. Patient colored a felt coloring sheet and was engaged in positive conversation with his peers during creative therapy. Therapist will continue to encourage and reinforce group attendance and participation.    Safety Plan:   Completed December 31, 2018.    Daily Group Attendance Note:    Orientation and Goals:       Attended No    Community Meeting:          Attended No    Psych-ed Group:                 Attended Yes    Group Therapy:                   Attended  Yes    Creative Therapy:               Attended Yes

## 2019-01-04 LAB — ECG 12-LEAD
Atrial Rate: 94 {beats}/min
P Axis: 54 degrees
P-R Interval: 124 ms
Q-T Interval: 344 ms
QRS Duration: 88 ms
QTC Calculation (Bezet): 430 ms
R Axis: 60 degrees
T Axis: 25 degrees
Ventricular Rate: 94 {beats}/min

## 2019-01-04 MED ORDER — HYDROXYZINE PAMOATE 50 MG PO CAPS
50.00 mg | ORAL_CAPSULE | Freq: Four times a day (QID) | ORAL | 0 refills | Status: AC | PRN
Start: 2019-01-04 — End: ?

## 2019-01-04 MED ORDER — QUETIAPINE FUMARATE ER 150 MG PO TB24
150.00 mg | ORAL_TABLET | Freq: Every evening | ORAL | 0 refills | Status: AC
Start: 2019-01-04 — End: ?

## 2019-01-04 MED ORDER — FLUTICASONE PROPIONATE 50 MCG/ACT NA SUSP
1.00 | Freq: Every day | NASAL | 0 refills | Status: AC
Start: 2019-01-05 — End: ?

## 2019-01-04 MED ORDER — TRAZODONE HCL 50 MG PO TABS
25.00 mg | ORAL_TABLET | Freq: Every evening | ORAL | 0 refills | Status: AC | PRN
Start: 2019-01-04 — End: ?

## 2019-01-04 MED ORDER — CETIRIZINE HCL 10 MG PO TABS
10.00 mg | ORAL_TABLET | Freq: Every day | ORAL | 0 refills | Status: AC
Start: 2019-01-05 — End: ?

## 2019-01-04 NOTE — Plan of Care (Signed)
Pt will be discharged to home on this date. Pt will be returning to [place]. Pt is scheduled @ [facility] in [place], [place] on 3/4. SW provided contact information to [facility] [address] [place], [place] [address] (walk-ins accepted MWT 7:30-11:00 am, MT 1:00-3:00 pm). Pt has his own vehicle to return to [place].

## 2019-01-04 NOTE — Progress Notes (Signed)
Patient received discharge order. Safety plan was completed and a copy given to patient. Discharge instructions reviewed with patient and patient was given a copy. Personal belongings returned. Patient discharged without incident....

## 2019-01-04 NOTE — Discharge Summary (Signed)
PSYCHIATRY DISCHARGE SUMMARY  AND POST DISCHARGE CONTINUING CARE PLAN    Date/Time: 01/04/2019 3:07 PM  Patient Name: Ricky Long,Ricky Long.  MRN#: [id number]  Age: 28 y.o.   Date of Birth: November 17, 1990    Date of Admission:   12/29/2018  Date of Discharge:     01/04/2019  Admitting Physician:    Leary Roca, MD  Discharge Physician:    Erin Sons, MD     Event leading to hospitalization / Reason for Hospitalization   Chief Complaint or Reason for Admission  I was "heatign myself up about the relapse"     B.History of Present Illness     (Symptoms and qualifiers:1-3 for brief, at least 4 for extended)    Patient is a 28 y.o. 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 He had a 4 year hx of use of 1.5 gram daily of meth for 4 years. This was from 20 to 24. He then went to a rehab and was doing well until 3 weeks ago when he relapsed. he reports his primary stressor was that last week he lost his job and he could not find other work. He was living with his father who reportedly has AD and he is not able to live with his father because he can not find work in [place].    Certainly, this will need to be clarified. He also has sig stress related to his ex wife ( who left him 8 years ago) related to his drug use. He has been unable to see his 52 year old son for 8 years since she moved to [place]. He went to rehab and now as working and was hoping to get some rights with his son and he had been unable to do so and with the loss of his jon he felt more hopeless and helpless and guily and relapsed. He was having sadness and anxiety prior to the job loss related to his dads illness and not seeing his son. He however at that time did  Appear to meet the MDD criteria. He does endorse usign the meth at 20 because it was the only thing thing that helped his mood.he then did have distict episodes where he felt anhedonic anergia helpless hopeless dan guilty. He has had no  formal psychiatric intervention except in the context of substance use.     He has been on vistaril for anxiety at rehab but no other medications. He does endorse a childhood hx of ADHD and can not remeber how he was dxd. He remembers feeling anxious related to his parents divorce when he was young and not able to see his dad. He developed UC which is well controlled (on diet) now.  He currently reports having a medical MJ card for MJ to tx his ADHD. However has legal charges pending possession of MJ and has a court date in April. Last night at time of admission he was feeling hopeless and helpless and guilty related to there elapse but is interested ing etting into a rehabb. He is also hoping to look  t skills and medicatons that can help him modulate his mood better. He does endorse 1 period of time when he was not using where he felt powerful and did nto need much sleep and was very productive., he has no psychotic hx.      On interview today he is certainly anxious and restless and hypervigilant but  no overt paranoia. He is cooeprative. He feels safe in the [place] but not on [place]. He has no active intent or plan. He has no hi or avh. He agrees to seroquel and   Was given consent with risks and benefits      Legal Status on admission was voluntary.    Discharge Diagnoses:       Principal Discharge Diagnosis: Adjustment Disorderwith Mixed Anxiety and Depressed Mood      Other  Psychiatric Diagnoses   Axis I    Unspecified Depressive Disorder   Cannabis Use Disorder,Severeand Stimulant Use Disorder,Severe   Substance Induced Mood Disorder- rule out Bipolar   Axis II    Deferred   Axis III  Past Medical History:   Diagnosis Date   . Calculus of kidney    . Crohn disease    . Hypertension      Past Surgical History:   Procedure Laterality Date   . APPENDECTOMY     . CHOLECYSTECTOMY     . LAPAROSCOPIC, APPENDECTOMY N/A 07/05/2015    Procedure: LAPAROSCOPIC, APPENDECTOMY;  Surgeon: Rosaria Ferries, MD;   Location: [id number];  Service: General;  Laterality: N/A;   . THYROIDECTOMY        Axis IV   Problems with primary support group   Problems related to the social environment   Occupational problems   Housing problems   Economic problems   Problems with access to health care services   Problems related to interaction with the legal system / crime   Other psychosocial and environmental problems   Axis V:  GAF at  Admission: 41-50: serious symptoms.          GAF at Discharge:61-70: mild symptoms.    Comorbid Conditions: Cannabis use Disorder - current or past and Stimulant use disorder    Labs/Scans/EKG     Results     Procedure Component Value Units Date/Time    TSH [[id number]] Collected:  12/29/18 2019    Specimen:  Blood Updated:  12/29/18 2128     TSH 0.77 uIU/mL     Ethanol (Alcohol) Level [[id number]] Collected:  12/29/18 2019    Specimen:  Blood Updated:  12/29/18 2114     Alcohol None Detected mg/dL     Acetaminophen level [[id number]]  (Abnormal) Collected:  12/29/18 2019    Specimen:  Blood Updated:  12/29/18 2114     Acetaminophen Level <7 ug/mL     Salicylate level [[id number]]  (Abnormal) Collected:  12/29/18 2019    Specimen:  Blood Updated:  12/29/18 2114     Salicylate Level <5.0 mg/dL     GFR [[id number]] Collected:  12/29/18 2019     Updated:  12/29/18 2114     EGFR >60.0    Comprehensive metabolic panel [[id number]] Collected:  12/29/18 2019    Specimen:  Blood Updated:  12/29/18 2114     Glucose 77 mg/dL      BUN 63.8 mg/dL      Creatinine 0.9 mg/dL      Sodium 756 mEq/L      Potassium 4.1 mEq/L      Chloride 101 mEq/L      CO2 24 mEq/L      Calcium 10.0 mg/dL      Protein, Total 7.8 g/dL      Albumin 4.6 g/dL      AST (SGOT) 28 U/L      ALT 15 U/L      Alkaline Phosphatase 61  U/L      Bilirubin, Total 0.6 mg/dL      Globulin 3.2 g/dL      Albumin/Globulin Ratio 1.4    Magnesium [[id number]] Collected:  12/29/18 2019    Specimen:  Blood Updated:  12/29/18 2114     Magnesium 1.8 mg/dL      Phosphorus [[id number]] Collected:  12/29/18 2019    Specimen:  Blood Updated:  12/29/18 2114     Phosphorus 4.2 mg/dL     Urine Rapid Drug Screen [[id number]]  (Abnormal) Collected:  12/29/18 2019    Specimen:  Urine Updated:  12/29/18 2105     Amphetamine Screen, UR Negative     Barbiturate Screen, UR Negative     Benzodiazepine Screen, UR Negative     Cannabinoid Screen, UR Positive     Cocaine, UR Negative     Opiate Screen, UR Negative     PCP Screen, UR Negative    CBC and differential [[id number]]  (Abnormal) Collected:  12/29/18 2019    Specimen:  Blood Updated:  12/29/18 2040     WBC 8.72 x10 3/uL      Hgb 13.5 g/dL      Hematocrit 62.9 %      Platelets 353 x10 3/uL      RBC 4.47 x10 6/uL      MCV 90.8 fL      MCH 30.2 pg      MCHC 33.3 g/dL      RDW 13 %      MPV 8.4 fL      Neutrophils 57.7 %      Lymphocytes Automated 36.4 %      Monocytes 4.9 %      Eosinophils Automated 0.5 %      Basophils Automated 0.2 %      Immature Granulocyte 0.3 %      Nucleated RBC 0.0 /100 WBC      Neutrophils Absolute 5.03 x10 3/uL      Abs Lymph Automated 3.17 x10 3/uL      Abs Mono Automated 0.43 x10 3/uL      Abs Eos Automated 0.04 x10 3/uL      Absolute Baso Automated 0.02 x10 3/uL      Absolute Immature Granulocyte 0.03 x10 3/uL      Absolute NRBC 0.00 x10 3/uL         No results found for any visits on 12/29/18.  Cardiology Results     Procedure Component Value Units Date/Time    ECG 12 Lead [[id number]] Collected:  12/29/18 1925     Updated:  12/30/18 1238     Ventricular Rate 93 BPM      Atrial Rate 93 BPM      P-R Interval 124 ms      QRS Duration 82 ms      Q-T Interval 348 ms      QTC Calculation (Bezet) 432 ms      P Axis 62 degrees      R Axis 61 degrees      T Axis 39 degrees     Narrative:       NORMAL SINUS RHYTHM  NORMAL ECG  NO PREVIOUS ECGS AVAILABLE  Confirmed by Neomia Dear MD, JOHN ([id number]) on 12/30/2018 12:38:38 PM    ECG 12 lead [[id number]] Collected:  01/03/19 1512     Updated:  01/04/19 0618     Ventricular Rate  94 BPM      Atrial Rate 94 BPM  P-R Interval 124 ms      QRS Duration 88 ms      Q-T Interval 344 ms      QTC Calculation (Bezet) 430 ms      P Axis 54 degrees      R Axis 60 degrees      T Axis 25 degrees     Narrative:       NORMAL SINUS RHYTHM  NORMAL ECG  WHEN COMPARED WITH ECG OF 29-Dec-2018 19:25,  NO SIGNIFICANT CHANGE WAS FOUND  Confirmed by Nile Riggs MD, ROBERT ([id number]) on 01/04/2019 6:18:37 AM          Consults:     Consult Orders (From admission, onward)    None        None.    Discharge Day Evaluation     Blood pressure 124/65, pulse 75, temperature 97.9 F (36.6 C), temperature source Oral, resp. rate 20, height 1.575 m (5\' 2" ), weight 56.7 kg (125 lb), SpO2 98 %.    Mental Status Exam    General appearance: Appears chronological age, Good hygiene and Good grooming  Attitude/Behavior: Calm, Cooperative and Eye Contact is  Good  Motor: No abnormalities noted  Gait: No obvious abnormalities  Muscle strength and tone: Not tested  Speech:   Spontaneous: Yes  Rate and Rhythm: Increased  Volume: Loud  Tone: Normal  Clarity: Yes  Mood: "Good"  Affect:   Range: Constricted  Stability: Stable  Appropriateness to thought content: Yes  Intensity: Normal  Thought Process:   Coherent: Yes  Logical:  Yes  Associations: Goal-directed  Thought Content:   Delusions:  Not elicited  Depressive Cognitions:  None  Suicidal:  No suicidal thoughts  Homicidal:  No homicidal thoughts  Violent Thoughts:  No  Perceptions:   Hallucinations: No  Insight: Fair  Judgment: Fair  Cognition:       Level of Consciousness: Intact  Orientation: Intact to self, place and time  Recent Memory: Intact  Remote Memory: Intact    Review of Systems  Psychiatric: No complaints  Constitutional: No complaints  Cardiovascular: No complaints  Respiratory: No complaints  Neurological: No complaints    Hospital Course:   Patient was admitted voluntarily to inpatient psychiatry on 12/30/2018 after he relapsed on methamphetamine three days prior to  admission in the context of multiple psychosocial stressors. On admission, he was anxious, restless, hypervigilant but not paranoid. He described his mood as down, he verbalized hopelessness, helplessness, worthlessness and guilt. He denied active self-injurious thoughts, suicidal or homicidal ideation or plan. He had no auditory or visual hallucinations.  His speech rate and rhythm was increased.     A tentative diagnosis of Unspecified Depressive Disorder, Cannabis and Stimulant Use Disorder severe, Adjustment Disorder with mixed anxiety and depressed mood, and Rule out Substance induced mood disorder were given. He was offered Quetiapine for mood stabilization and he accepted it. He reported sedation from the immediate release version and as such was transitioned to the extended release and tolerated it well and his trazodone dose was also decreased from 50mg  to 25mg  at bedtime. His increasing speech rate and rhythm which sometimes becomes incomprehensible suggests he may have an underlying mood (Bipolar) disorder but this diagnosis cannot be made in light of his Cannabis use and recent relapse on methamphetamines. With some sober time, he should be re-evaluated for Bipolar disorder. He experienced one panic attack while hospitalized and was medicated with lorazepam 1mg  with desired outcomes. Psychiatrically, he has been stabilized on  the quetiapine and wants to continue quetiapine post discharge.    From a behavioral standpoint, he was calm and cooperative with care. He was isolative to his room most of the time but attended some select groups. He had no safety or behavioral concerns while hospitalized.    He denies and continues to deny self-injurious thoughts, suicidal or homicidal ideation. He remains hopeful for the future; he wants to stay sober and is open to attending an outpatient substance abuse treatment program in [place], long term he wants to have some visitation rights to his son. He was discharge  home today and will walk in to the [facility] tomorrow to begin substance abuse treatment.  He denies and continues to deny paranoia, auditory or visual hallucinations.  It was our fervent hope that he transitioned from this facility to a residential substance abuse treatment program but patient preferred to be discharge home to see his father before rehab.  He will be discharge back home [facility]) with follow up with the [facility] in [place], [place].      Suicidal and Homicidal Status on Discharge:   Patient denies suicidal and homicidal ideation, intent and plan.    Discharge Instructions:   Discharge Disposition: Home - Self Care    Discharge Instructions given to: Patient    Questions that may arise between hospital discharge and your first follow-up appointment should be directed to [facility]: [phone]; 911 or the closest emergency department      Discharge Plan:       Attestation:   The patient has been seen and evaluated by me,  Ruffin Cayde Held, DNP NP    Patient discharged on more than 1 antipsychotic medication? No  On discharge, was the patient on any psychotropic medication off label? No  I spent at least 35 minutes coordinating the discharge and reviewing the discharge plan.    Discharge Medications:     Tobacco Cessation Discharge Prescription for Cessation Medication  Patient was assessed on admission and found not to be a tobacco user.         Discharge Medication List      Taking    cetirizine 10 MG tablet  Dose:  10 mg  Commonly known as:  ZyrTEC  Start taking on:  January 05, 2019  Take 1 tablet (10 mg total) by mouth daily     fluticasone 50 MCG/ACT nasal spray  Dose:  1 spray  Commonly known as:  FLONASE  For:  Signs and Symptoms of Nose Diseases  Start taking on:  January 05, 2019  1 spray by Nasal route daily     hydrOXYzine 50 MG capsule  Dose:  50 mg  Commonly known as:  VISTARIL  For:  Feeling Anxious  Take 1 capsule (50 mg  total) by mouth 4 (four) times daily as needed for Anxiety     QUEtiapine 150 MG Tb24  Dose:  150 mg  Commonly known as:  SEROquel-XR  For:  Unspecified mood disorder  Take 1 tablet (150 mg total) by mouth nightly     traZODone 50 MG tablet  Dose:  25 mg  Commonly known as:  DESYREL  For:  Trouble Sleeping  Take 0.5 tablets (25 mg total) by mouth nightly as needed for Sleep              Signed by: Ruffin Azalea Cedar, DNP NP   01/04/2019  3:07 PM

## 2019-01-04 NOTE — Plan of Care (Signed)
Problem: At Risk for Harm to Self: AS EVIDENCED BY...  Goal: Attends a minimum number of therapies daily  Outcome: Progressing   Problem: At Risk for Harm to Self: AS EVIDENCED BY...  Goal: Completes discharge safety and recovery plan  Outcome: Progressing    Pt was mostly in his room,resting. He was receptive to approach. Pt reports he attended group but felt anxious and left. Presently he appears calm.  He is med compliant. He was given  Tylenol 650 mg for headache and Vistaril 50 mg for anxiety. The plan is to discharge him today.

## 2019-01-04 NOTE — Plan of Care (Signed)
Problem: At Risk for Harm to Self: AS EVIDENCED BY...  Goal: Verbalizes understanding of medication, benefits, and side effects  Outcome: Progressing     Problem: At Risk for Harm to Self: AS EVIDENCED BY...  Goal: Identifies stressors, protective factors, and coping strategies  Outcome: Progressing           Ricky Long was visible on the unit, social with peers.  When he was in his room, he became very agitated and upset, talking so fast that many words were unintelligible "Oh, I've always talked like this," he said when given that feedback.  "I've always had racing thoughts."  Ricky Long stated that he was agitated because he'd wanted to be discharged to rehab and because "no one has talked to me about how to get on medicaid.  I'm worried about how to pay for this.  People just stood in the nurses station, laughing and ignoring me instead of helping me get medicaid.  I get it.  I understand."  He was blaming and unable to focus on his role in recovery.  He denies SI/HI/AVH.
